# Patient Record
Sex: Female | Born: 1950 | Race: White | Hispanic: No | Smoking: Never smoker
Health system: Southern US, Community
[De-identification: ages and names within clinical notes are randomized; demographics above are authoritative.]

## PROBLEM LIST (undated history)

## (undated) DIAGNOSIS — IMO0001 Reserved for inherently not codable concepts without codable children: Secondary | ICD-10-CM

## (undated) DIAGNOSIS — D649 Anemia, unspecified: Secondary | ICD-10-CM

## (undated) DIAGNOSIS — H269 Unspecified cataract: Secondary | ICD-10-CM

## (undated) DIAGNOSIS — I499 Cardiac arrhythmia, unspecified: Secondary | ICD-10-CM

## (undated) DIAGNOSIS — C801 Malignant (primary) neoplasm, unspecified: Secondary | ICD-10-CM

## (undated) DIAGNOSIS — K219 Gastro-esophageal reflux disease without esophagitis: Secondary | ICD-10-CM

## (undated) DIAGNOSIS — K649 Unspecified hemorrhoids: Secondary | ICD-10-CM

## (undated) DIAGNOSIS — K635 Polyp of colon: Secondary | ICD-10-CM

## (undated) HISTORY — DX: Polyp of colon: K63.5

## (undated) HISTORY — PX: OPEN REDUCTION INTERNAL FIXATION (ORIF) FOOT LISFRANC FRACTURE: SHX5990

## (undated) HISTORY — DX: Unspecified hemorrhoids: K64.9

## (undated) HISTORY — DX: Unspecified cataract: H26.9

## (undated) HISTORY — PX: MOHS SURGERY: SUR867

## (undated) HISTORY — PX: ARM WOUND REPAIR / CLOSURE: SUR1141

## (undated) HISTORY — DX: Anemia, unspecified: D64.9

## (undated) HISTORY — PX: ABDOMINAL HYSTERECTOMY: SHX81

---

## 2003-11-29 ENCOUNTER — Emergency Department (HOSPITAL_COMMUNITY): Admission: EM | Admit: 2003-11-29 | Discharge: 2003-11-29 | Payer: Self-pay | Admitting: Emergency Medicine

## 2003-11-30 ENCOUNTER — Emergency Department (HOSPITAL_COMMUNITY): Admission: EM | Admit: 2003-11-30 | Discharge: 2003-11-30 | Payer: Self-pay | Admitting: Emergency Medicine

## 2003-12-03 ENCOUNTER — Ambulatory Visit: Payer: Self-pay | Admitting: Internal Medicine

## 2003-12-09 ENCOUNTER — Ambulatory Visit: Payer: Self-pay | Admitting: Internal Medicine

## 2010-03-22 ENCOUNTER — Emergency Department: Payer: Self-pay | Admitting: Emergency Medicine

## 2010-07-11 ENCOUNTER — Emergency Department: Payer: Self-pay | Admitting: Internal Medicine

## 2011-07-24 IMAGING — CT CT HEAD WITHOUT CONTRAST
2 series · 16 of 30 positions shown, 20 images · non-contrast
Comparison: none

REASON FOR EXAM: fall/LOC
COMMENTS:

PROCEDURE:     CT  - CT HEAD WITHOUT CONTRAST  - July 11, 2010  [DATE]
RESULT:     Comparison:  None
TECHNIQUE: Multiple axial images from the foramen magnum to the vertex were
obtained without IV contrast.

[Series 2: without · axial · non-contrast · 0.41mm/px · z∈[-208,-88]mm · 13 of 30 slices shown, 17 images]
[im 3/30  brain]
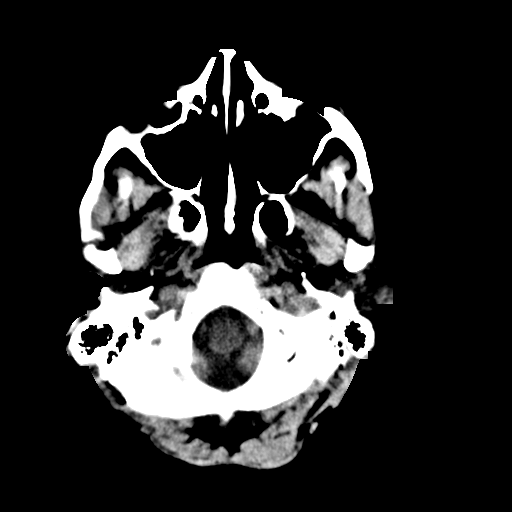
[im 3/30  bone]
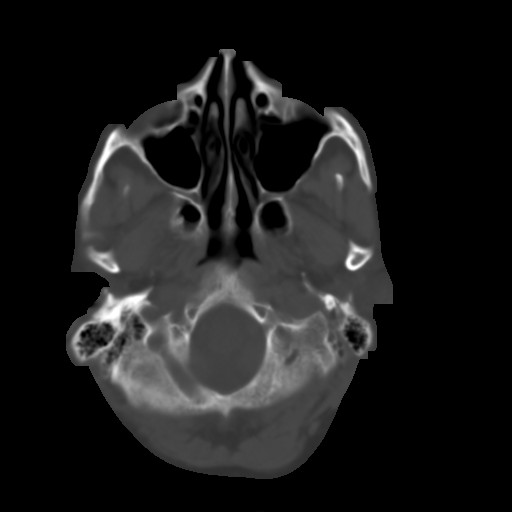
[im 5/30  brain]
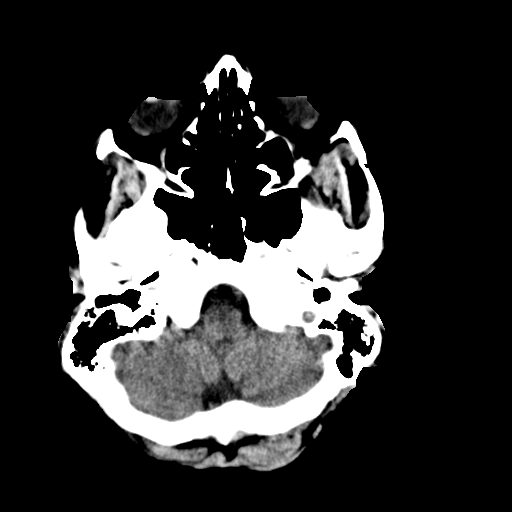
[im 7/30  brain]
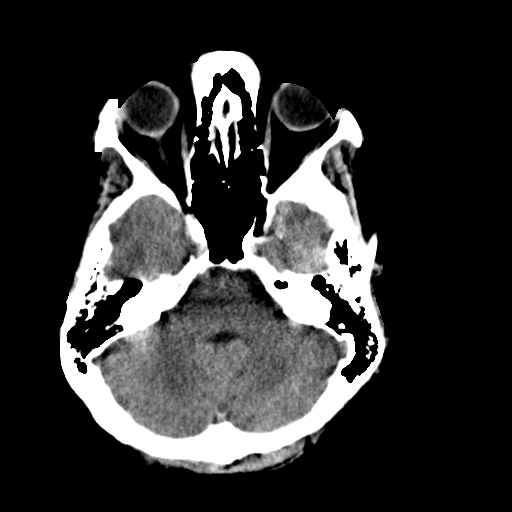
[im 9/30  brain]
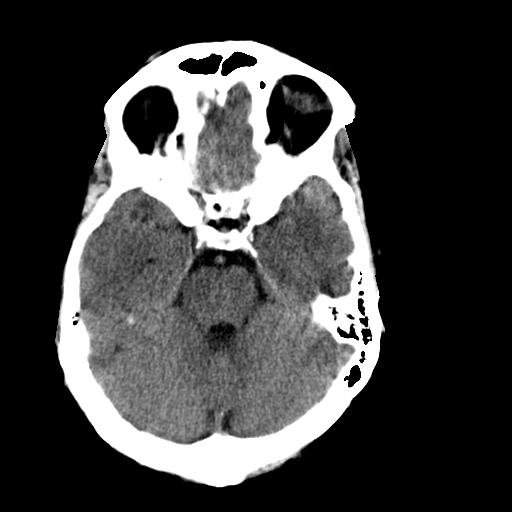
[im 11/30  brain]
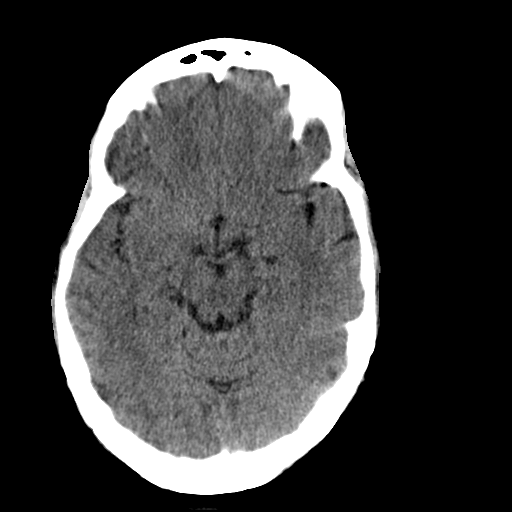
[im 11/30  bone]
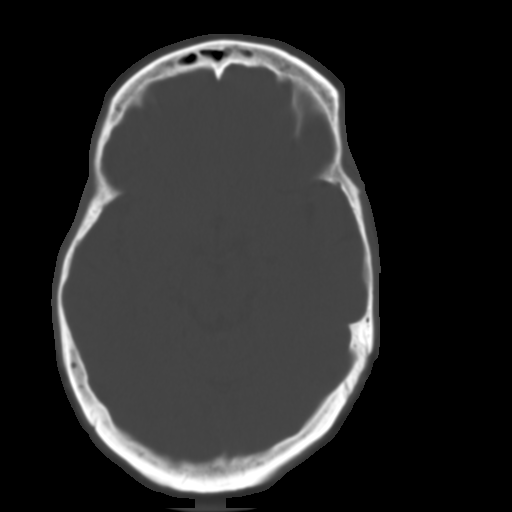
[im 13/30  brain]
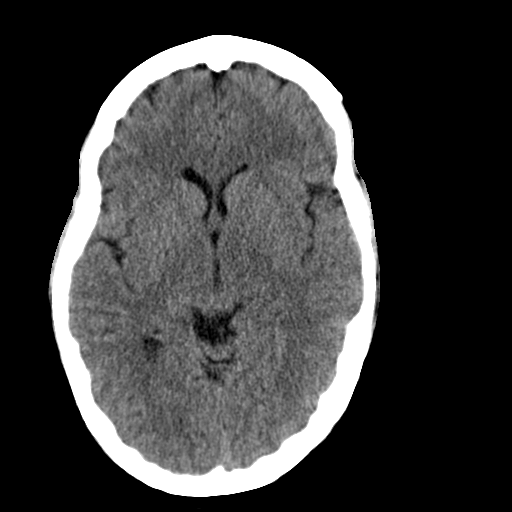
[im 15/30  brain]
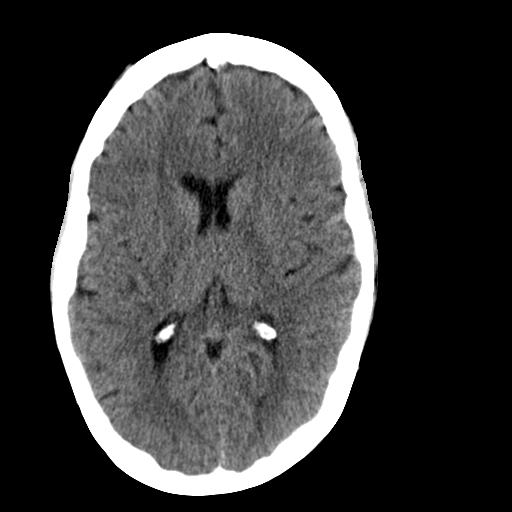
[im 17/30  brain]
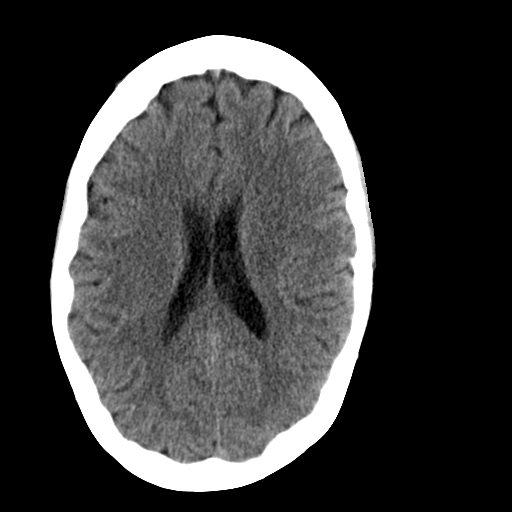
[im 19/30  brain]
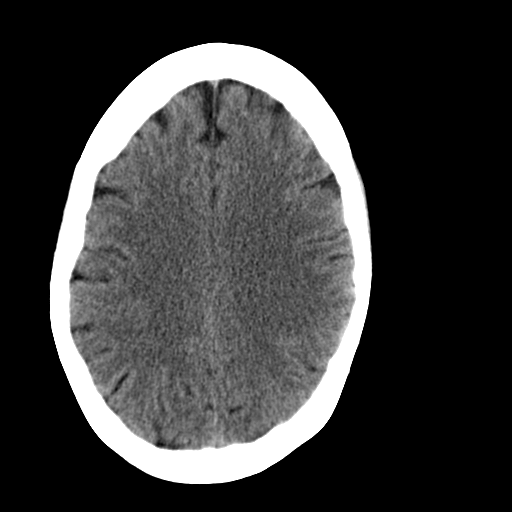
[im 19/30  bone]
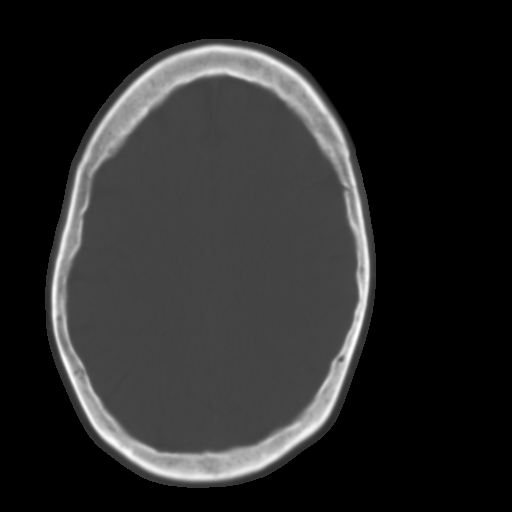
[im 21/30  brain]
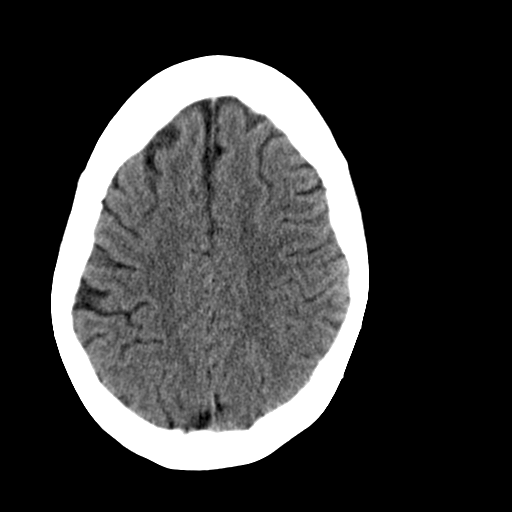
[im 23/30  brain]
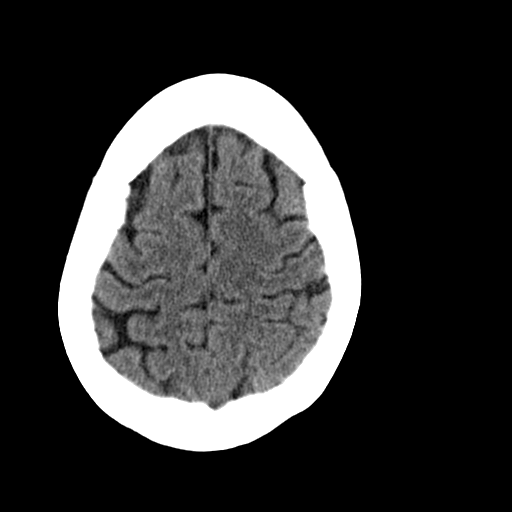
[im 25/30  brain]
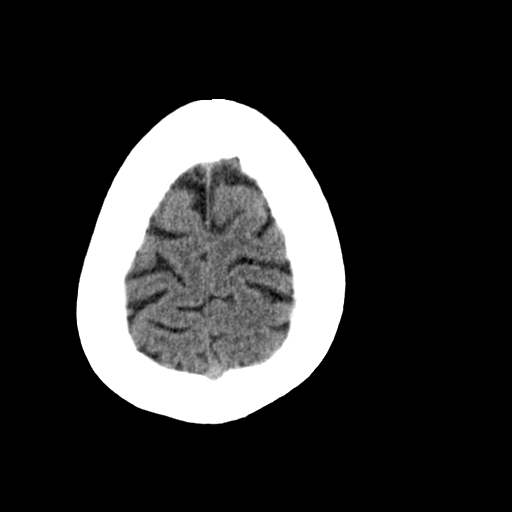
[im 27/30  brain]
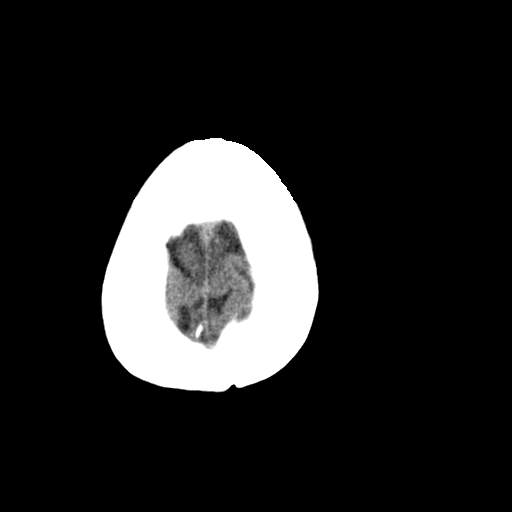
[im 27/30  bone]
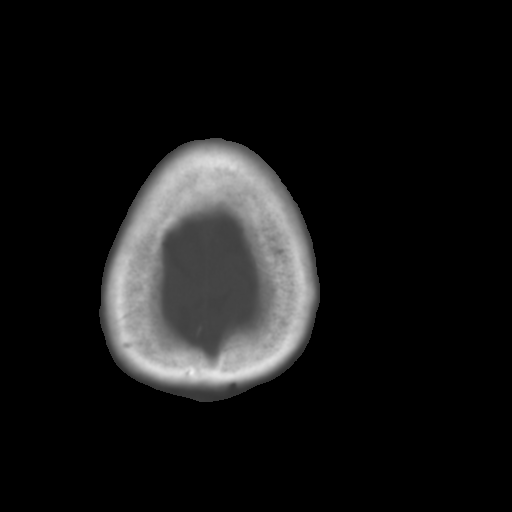

[Series 3: bone · axial · 0.41mm/px · z∈[-208,-168]mm · 3 of 30 slices shown]
[im 3/30  bone]
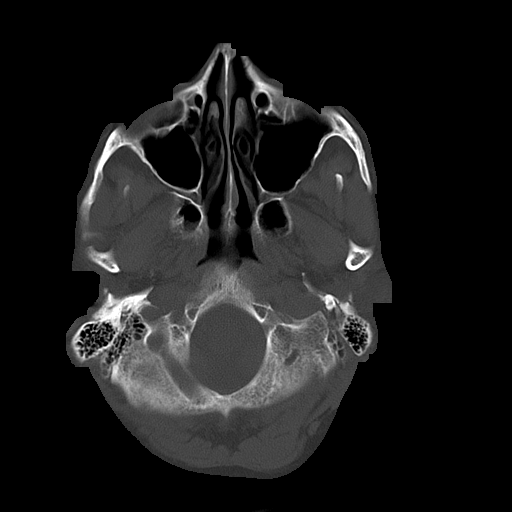
[im 7/30  bone]
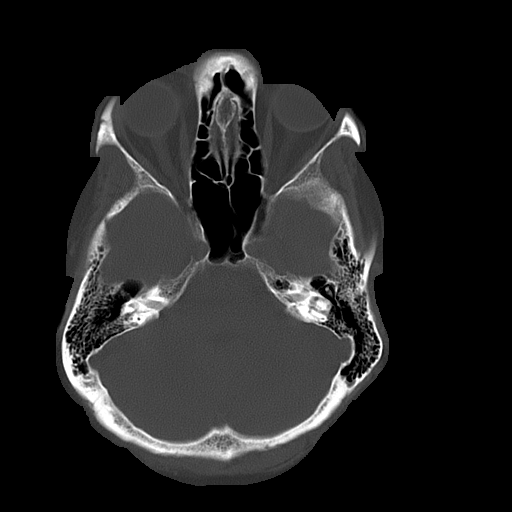
[im 11/30  bone]
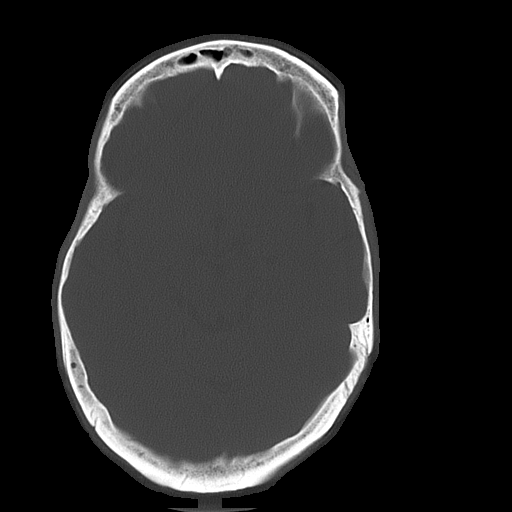

[16 of 30 positions shown; findings below may reference images not displayed]

FINDINGS: There is no evidence for mass effect, midline shift, or extra-axial fluid
collections. There is no evidence for space-occupying lesion, intracranial
hemorrhage, or cortical-based area of infarction.

The osseous structures are unremarkable.
IMPRESSION: No acute intracranial process.

## 2011-11-15 ENCOUNTER — Emergency Department: Payer: Self-pay | Admitting: Emergency Medicine

## 2011-11-15 LAB — URINALYSIS, COMPLETE
Bilirubin,UR: NEGATIVE
Glucose,UR: NEGATIVE mg/dL (ref 0–75)
Ketone: NEGATIVE
Nitrite: NEGATIVE
Ph: 8 (ref 4.5–8.0)
Protein: 100
RBC,UR: 287 /HPF (ref 0–5)
Specific Gravity: 1.012 (ref 1.003–1.030)
Squamous Epithelial: NONE SEEN
WBC UR: 666 /HPF (ref 0–5)

## 2011-11-17 LAB — URINE CULTURE

## 2014-02-27 DIAGNOSIS — H269 Unspecified cataract: Secondary | ICD-10-CM

## 2014-02-27 HISTORY — DX: Unspecified cataract: H26.9

## 2014-12-03 ENCOUNTER — Encounter: Payer: Self-pay | Admitting: *Deleted

## 2014-12-06 NOTE — H&P (Signed)
See scanned H&P

## 2014-12-07 ENCOUNTER — Ambulatory Visit: Payer: 59 | Admitting: *Deleted

## 2014-12-07 ENCOUNTER — Ambulatory Visit
Admission: RE | Admit: 2014-12-07 | Discharge: 2014-12-07 | Disposition: A | Discharge status: Home / Self Care | Payer: 59 | Source: Ambulatory Visit | Attending: Ophthalmology | Admitting: Ophthalmology

## 2014-12-07 ENCOUNTER — Encounter
Admission: RE | Disposition: A | Discharge status: Home / Self Care | Payer: Self-pay | Source: Ambulatory Visit | Attending: Ophthalmology

## 2014-12-07 DIAGNOSIS — R0602 Shortness of breath: Secondary | ICD-10-CM | POA: Diagnosis not present

## 2014-12-07 DIAGNOSIS — Z85828 Personal history of other malignant neoplasm of skin: Secondary | ICD-10-CM | POA: Diagnosis not present

## 2014-12-07 DIAGNOSIS — K219 Gastro-esophageal reflux disease without esophagitis: Secondary | ICD-10-CM | POA: Insufficient documentation

## 2014-12-07 DIAGNOSIS — I499 Cardiac arrhythmia, unspecified: Secondary | ICD-10-CM | POA: Insufficient documentation

## 2014-12-07 DIAGNOSIS — J40 Bronchitis, not specified as acute or chronic: Secondary | ICD-10-CM | POA: Diagnosis not present

## 2014-12-07 DIAGNOSIS — H2512 Age-related nuclear cataract, left eye: Secondary | ICD-10-CM | POA: Diagnosis present

## 2014-12-07 DIAGNOSIS — Z90710 Acquired absence of both cervix and uterus: Secondary | ICD-10-CM | POA: Insufficient documentation

## 2014-12-07 HISTORY — PX: CATARACT EXTRACTION W/PHACO: SHX586

## 2014-12-07 HISTORY — DX: Reserved for inherently not codable concepts without codable children: IMO0001

## 2014-12-07 HISTORY — DX: Gastro-esophageal reflux disease without esophagitis: K21.9

## 2014-12-07 HISTORY — DX: Malignant (primary) neoplasm, unspecified: C80.1

## 2014-12-07 HISTORY — DX: Cardiac arrhythmia, unspecified: I49.9

## 2014-12-07 SURGERY — PHACOEMULSIFICATION, CATARACT, WITH IOL INSERTION
Anesthesia: Monitor Anesthesia Care | Site: Eye | Laterality: Left | Wound class: Clean

## 2014-12-07 MED ORDER — HYALURONIDASE HUMAN 150 UNIT/ML IJ SOLN
INTRAMUSCULAR | Status: AC
Start: 2014-12-07 — End: 2014-12-07
  Filled 2014-12-07: qty 1

## 2014-12-07 MED ORDER — PHENYLEPHRINE HCL 10 % OP SOLN
OPHTHALMIC | Status: AC
  Administered 2014-12-07: 1 [drp] via OPHTHALMIC
  Filled 2014-12-07: qty 5

## 2014-12-07 MED ORDER — BSS IO SOLN
INTRAOCULAR | Status: DC | PRN
  Administered 2014-12-07: 200 mL via OPHTHALMIC

## 2014-12-07 MED ORDER — MOXIFLOXACIN HCL 0.5 % OP SOLN
OPHTHALMIC | Status: DC | PRN
  Administered 2014-12-07: 1 [drp] via OPHTHALMIC

## 2014-12-07 MED ORDER — TETRACAINE HCL 0.5 % OP SOLN
OPHTHALMIC | Status: DC | PRN
  Administered 2014-12-07: 2 [drp] via OPHTHALMIC

## 2014-12-07 MED ORDER — BUPIVACAINE HCL (PF) 0.75 % IJ SOLN
INTRAMUSCULAR | Status: AC
  Filled 2014-12-07: qty 10

## 2014-12-07 MED ORDER — MIDAZOLAM HCL 2 MG/2ML IJ SOLN
INTRAMUSCULAR | Status: DC | PRN
  Administered 2014-12-07: 1 mg via INTRAVENOUS

## 2014-12-07 MED ORDER — PHENYLEPHRINE HCL 10 % OP SOLN
1.0000 [drp] | OPHTHALMIC | Status: AC | PRN
  Administered 2014-12-07 (×4): 1 [drp] via OPHTHALMIC

## 2014-12-07 MED ORDER — TETRACAINE HCL 0.5 % OP SOLN
OPHTHALMIC | Status: AC
  Filled 2014-12-07: qty 2

## 2014-12-07 MED ORDER — MOXIFLOXACIN HCL 0.5 % OP SOLN
1.0000 [drp] | OPHTHALMIC | Status: AC | PRN
  Administered 2014-12-07 (×3): 1 [drp] via OPHTHALMIC

## 2014-12-07 MED ORDER — CEFUROXIME OPHTHALMIC INJECTION 1 MG/0.1 ML
INJECTION | OPHTHALMIC | Status: AC
  Filled 2014-12-07: qty 0.1

## 2014-12-07 MED ORDER — LIDOCAINE HCL (PF) 4 % IJ SOLN
INTRAMUSCULAR | Status: DC | PRN
  Administered 2014-12-07: 5 mL via OPHTHALMIC

## 2014-12-07 MED ORDER — NA CHONDROIT SULF-NA HYALURON 40-17 MG/ML IO SOLN
INTRAOCULAR | Status: AC
  Filled 2014-12-07: qty 1

## 2014-12-07 MED ORDER — NA CHONDROIT SULF-NA HYALURON 40-17 MG/ML IO SOLN
INTRAOCULAR | Status: DC | PRN
  Administered 2014-12-07: 1 mL via INTRAOCULAR

## 2014-12-07 MED ORDER — PROPARACAINE HCL 0.5 % OP SOLN
OPHTHALMIC | Status: DC | PRN
  Administered 2014-12-07: 2 [drp] via OPHTHALMIC

## 2014-12-07 MED ORDER — EPINEPHRINE HCL 1 MG/ML IJ SOLN
INTRAMUSCULAR | Status: AC
  Filled 2014-12-07: qty 1

## 2014-12-07 MED ORDER — CARBACHOL 0.01 % IO SOLN
INTRAOCULAR | Status: DC | PRN
Start: 2014-12-07 — End: 2014-12-07
  Administered 2014-12-07: 0.5 mL via INTRAOCULAR

## 2014-12-07 MED ORDER — CYCLOPENTOLATE HCL 2 % OP SOLN
OPHTHALMIC | Status: AC
  Administered 2014-12-07: 1 [drp] via OPHTHALMIC
  Filled 2014-12-07: qty 2

## 2014-12-07 MED ORDER — PROPARACAINE HCL 0.5 % OP SOLN
OPHTHALMIC | Status: AC
  Filled 2014-12-07: qty 15

## 2014-12-07 MED ORDER — ALFENTANIL 500 MCG/ML IJ INJ
INJECTION | INTRAMUSCULAR | Status: DC | PRN
  Administered 2014-12-07: 500 ug via INTRAVENOUS

## 2014-12-07 MED ORDER — LIDOCAINE HCL (PF) 4 % IJ SOLN
INTRAOCULAR | Status: DC | PRN
  Administered 2014-12-07: 4 mL via OPHTHALMIC

## 2014-12-07 MED ORDER — SODIUM CHLORIDE 0.9 % IV SOLN
INTRAVENOUS | Status: DC
  Administered 2014-12-07: 11:00:00 via INTRAVENOUS

## 2014-12-07 MED ORDER — MOXIFLOXACIN HCL 0.5 % OP SOLN
OPHTHALMIC | Status: AC
  Administered 2014-12-07: 1 [drp] via OPHTHALMIC
  Filled 2014-12-07: qty 3

## 2014-12-07 MED ORDER — CEFUROXIME OPHTHALMIC INJECTION 1 MG/0.1 ML
INJECTION | OPHTHALMIC | Status: DC | PRN
  Administered 2014-12-07: 0.1 mL via OPHTHALMIC

## 2014-12-07 MED ORDER — CYCLOPENTOLATE HCL 2 % OP SOLN
1.0000 [drp] | OPHTHALMIC | Status: AC | PRN
  Administered 2014-12-07 (×4): 1 [drp] via OPHTHALMIC

## 2014-12-07 MED ORDER — LIDOCAINE HCL (PF) 4 % IJ SOLN
INTRAMUSCULAR | Status: AC
  Filled 2014-12-07: qty 5

## 2014-12-07 SURGICAL SUPPLY — 33 items
CANNULA ANT/CHMB 27G (MISCELLANEOUS) ×1 IMPLANT
CANNULA ANT/CHMB 27GA (MISCELLANEOUS) ×3 IMPLANT
CORD BIP STRL DISP 12FT (MISCELLANEOUS) ×3 IMPLANT
CUP MEDICINE 2OZ PLAST GRAD ST (MISCELLANEOUS) ×3 IMPLANT
DRAPE XRAY CASSETTE 23X24 (DRAPES) ×3 IMPLANT
ERASER HMR WETFIELD 18G (MISCELLANEOUS) ×3 IMPLANT
GLOVE BIO SURGEON STRL SZ8 (GLOVE) ×3 IMPLANT
GLOVE SURG LX 6.5 MICRO (GLOVE) ×2
GLOVE SURG LX 8.0 MICRO (GLOVE) ×2
GLOVE SURG LX STRL 6.5 MICRO (GLOVE) ×1 IMPLANT
GLOVE SURG LX STRL 8.0 MICRO (GLOVE) ×1 IMPLANT
GOWN STRL REUS W/ TWL LRG LVL3 (GOWN DISPOSABLE) ×1 IMPLANT
GOWN STRL REUS W/ TWL XL LVL3 (GOWN DISPOSABLE) ×1 IMPLANT
GOWN STRL REUS W/TWL LRG LVL3 (GOWN DISPOSABLE) ×3
GOWN STRL REUS W/TWL XL LVL3 (GOWN DISPOSABLE) ×3
LENS IOL ACRSF IQ TRC 9 24.5 (Intraocular Lens) IMPLANT
LENS IOL ACRYSOF IQ TORIC 24.5 (Intraocular Lens) ×3 IMPLANT
LENS IOL IQ TORIC 9 24.5 (Intraocular Lens) ×1 IMPLANT
MARKER SKIN W/RULER 31145785 (MISCELLANEOUS) ×2 IMPLANT
PACK CATARACT (MISCELLANEOUS) ×3 IMPLANT
PACK CATARACT DINGLEDEIN LX (MISCELLANEOUS) ×3 IMPLANT
PACK EYE AFTER SURG (MISCELLANEOUS) ×3 IMPLANT
SHLD EYE VISITEC  UNIV (MISCELLANEOUS) ×3 IMPLANT
SOL BSS BAG (MISCELLANEOUS) ×3
SOL PREP PVP 2OZ (MISCELLANEOUS) ×3
SOLUTION BSS BAG (MISCELLANEOUS) ×1 IMPLANT
SOLUTION PREP PVP 2OZ (MISCELLANEOUS) ×1 IMPLANT
SUT SILK 5-0 (SUTURE) ×3 IMPLANT
SYR 3ML LL SCALE MARK (SYRINGE) ×3 IMPLANT
SYR 5ML LL (SYRINGE) ×3 IMPLANT
SYR TB 1ML 27GX1/2 LL (SYRINGE) ×3 IMPLANT
WATER STERILE IRR 1000ML POUR (IV SOLUTION) ×3 IMPLANT
WIPE NON LINTING 3.25X3.25 (MISCELLANEOUS) ×3 IMPLANT

## 2014-12-07 NOTE — Anesthesia Postprocedure Evaluation (Signed)
  Anesthesia Post-op Note  Patient: Anna Liu  Procedure(s) Performed: Procedure(s) with comments: CATARACT EXTRACTION PHACO AND INTRAOCULAR LENS PLACEMENT (IOC) (Left) - MZT#[id number] H us:01:14.8 AP%:24.1 CDE:32.29  Anesthesia type:MAC  Patient location: PACU  Post pain: Pain level controlled  Post assessment: Post-op Vital signs reviewed, Patient's Cardiovascular Status Stable, Respiratory Function Stable, Patent Airway and No signs of Nausea or vomiting  Post vital signs: Reviewed and stable  Last Vitals:  Filed Vitals:   12/07/14 1448  BP: 158/83  Pulse: 67  Temp:   Resp: 16    Level of consciousness: awake, alert  and patient cooperative  Complications: No apparent anesthesia complications

## 2014-12-07 NOTE — Interval H&P Note (Signed)
History and Physical Interval Note:  12/07/2014 1:47 PM  Anna Liu  has presented today for surgery, with the diagnosis of CATARACT  The various methods of treatment have been discussed with the patient and family. After consideration of risks, benefits and other options for treatment, the patient has consented to  Procedure(s): CATARACT EXTRACTION PHACO AND INTRAOCULAR LENS PLACEMENT ([place]) (Left) as a surgical intervention .  The patient's history has been reviewed, patient examined, no change in status, stable for surgery.  I have reviewed the patient's chart and labs.  Questions were answered to the patient's satisfaction.     Katrena Stehlin

## 2014-12-07 NOTE — Anesthesia Preprocedure Evaluation (Signed)
Anesthesia Evaluation  Patient identified by MRN, date of birth, ID band Patient awake    Reviewed: Allergy & Precautions, NPO status , Patient's Chart, lab work & pertinent test results  Airway Mallampati: II  TM Distance: >3 FB Neck ROM: Limited    Dental  (+) Teeth Intact   Pulmonary shortness of breath and with exertion,           Cardiovascular negative cardio ROS Normal cardiovascular exam     Neuro/Psych    GI/Hepatic   Endo/Other    Renal/GU      Musculoskeletal   Abdominal (+)  Abdomen: soft.    Peds  Hematology   Anesthesia Other Findings   Reproductive/Obstetrics                             Anesthesia Physical Anesthesia Plan  ASA: II  Anesthesia Plan: MAC   Post-op Pain Management:    Induction: Intravenous  Airway Management Planned: Nasal Cannula  Additional Equipment:   Intra-op Plan:   Post-operative Plan:   Informed Consent: I have reviewed the patients History and Physical, chart, labs and discussed the procedure including the risks, benefits and alternatives for the proposed anesthesia with the patient or authorized representative who has indicated his/her understanding and acceptance.     Plan Discussed with: CRNA  Anesthesia Plan Comments:         Anesthesia Quick Evaluation

## 2014-12-07 NOTE — Anesthesia Procedure Notes (Signed)
Procedure Name: MAC Performed by: Jalaysia Lobb Pre-anesthesia Checklist: Patient identified, Emergency Drugs available, Suction available, Patient being monitored and Timeout performed Oxygen Delivery Method: Nasal cannula     

## 2014-12-07 NOTE — Discharge Instructions (Signed)
Eye Surgery Discharge Instructions  Expect mild scratchy sensation or mild soreness. DO NOT RUB YOUR EYE!  The day of surgery:  Minimal physical activity, but bed rest is not required  No reading, computer work, or close hand work  No bending, lifting, or straining.  May watch TV  For 24 hours:  No driving, legal decisions, or alcoholic beverages  Safety precautions  Eat anything you prefer: It is better to start with liquids, then soup then solid foods.  _____ Eye patch should be worn until postoperative exam tomorrow.  ____ Solar shield eyeglasses should be worn for comfort in the sunlight/patch while sleeping  Resume all regular medications including aspirin or Coumadin if these were discontinued prior to surgery. You may shower, bathe, shave, or wash your hair. Tylenol may be taken for mild discomfort.  Call your doctor if you experience significant pain, nausea, or vomiting, fever > 101 or other signs of infection. [phone] or [phone] Specific instructions:  Follow-up Information    Follow up with DINGELDEIN,STEVEN, MD In 1 day.   Specialty:  Ophthalmology   Why:  October 11 at 10:10am   Contact information:   [place] [address] [phone]     AMBULATORY SURGERY  DISCHARGE INSTRUCTIONS   1) The drugs that you were given will stay in your system until tomorrow so for the next 24 hours you should not:  A) Drive an automobile B) Make any legal decisions C) Drink any alcoholic beverage   2) You may resume regular meals tomorrow.  Today it is better to start with liquids and gradually work up to solid foods.  You may eat anything you prefer, but it is better to start with liquids, then soup and crackers, and gradually work up to solid foods.   3) Please notify your doctor immediately if you have any unusual bleeding, trouble breathing, redness and pain at the surgery site, drainage, fever, or pain not relieved by  medication.    4) Additional Instructions:        Please contact your physician with any problems or Same Day Surgery at [phone], Monday through Friday 6 am to 4 pm, or [place] at [facility] number at [phone].

## 2014-12-07 NOTE — Transfer of Care (Signed)
Immediate Anesthesia Transfer of Care Note  Patient: Anna Liu  Procedure(s) Performed: Procedure(s) with comments: CATARACT EXTRACTION PHACO AND INTRAOCULAR LENS PLACEMENT ([facility]) (Left) - VTV#[id number] H us:01:14.8 AP%:24.1 CDE:32.29  Patient Location: PACU  Anesthesia Type:MAC  Level of Consciousness: awake  Airway & Oxygen Therapy: Patient Spontanous Breathing  Post-op Assessment: Report given to RN and Post -op Vital signs reviewed and stable  Post vital signs: Reviewed and stable  Last Vitals:  Filed Vitals:   12/07/14 1443  BP: 140/66  Pulse: 61  Temp: 36.5 C  Resp: 16    Complications: No apparent anesthesia complications

## 2014-12-07 NOTE — Op Note (Signed)
Date of Surgery: 12/07/2014 Date of Dictation: 12/07/2014 2:40 PM Pre-operative Diagnosis:Nuclear Sclerotic Cataract left Eye Post-operative Diagnosis: same Procedure performed: Extra-capsular Cataract Extraction (ECCE) with placement of a posterior chamber intraocular lens (IOL) left Eye IOL:  Implant Name Type Inv. Item Serial No. Manufacturer Lot No. LRB No. Used  toric lens     [id number] ALCON   Left 1   Anesthesia: 2% Lidocaine and 4% Marcaine in a 50/50 mixture with 10 unites/ml of Hylenex given as a peribulbar Anesthesiologist: Anesthesiologist: Elyse Hsu, MD CRNA: Aline Brochure, CRNA; Allean Found, CRNA Complications: none Estimated Blood Loss: less than 1 ml  Description of procedure:  The patient sat upright and the eyes were anesthetized with topical proparacaine. With the patient fixing at a distant target the 3 o'clock and 9 o'clock positions at the limbus were marked with an sterile indelible surgical marking pen.  The patient was given anesthesia and sedation via intravenous access. The patient was then prepped and draped in the usual fashion. A 25-gauge needle was bent to use for starting the capsulorhexis. A 5-0 silk suture was placed through the conjunctiva superior and inferiorly to serve as bridle sutures.   The [place] system was engaged and registration was performed. The positions of the incisions (110) and the steep axis of the astigmatism (120 degrees) were identified by the [place] system and marked with an indelible pen. The Verion heads up display was turned off.  Hemostasis was obtained at the the position of the main incision using an eraser cautery. A partial thickness groove was made at the at that location with a 64 Beaver blade and this was dissected anteriorly with an Avaya. The anterior chamber was entered at 10 o'clock with a 1.0 mm paracentesis knife and through the lamellar dissection with a 2.6 mm Alcon keratome. Epi-Shugarcaine 0.5 CC  [9 cc BSS Plus (Alcon), 3 cc 4% preservative-free lidocaine (Hospira) and 4 cc 1:1000 preservative-free, bisulfite-free epinephrine] was injected into the anterior chamber via the paracentesis tract. DiscoVisc was injected to replace the aqueous and a continuous tear curvilinear capsulorhexis was performed using a bent 25-gauge needle.  Balance salt on a syringe was used to perform hydro-dissection and phacoemulsification was carried out using a divide and conquer technique. Procedure(s) with comments: CATARACT EXTRACTION PHACO AND INTRAOCULAR LENS PLACEMENT (IOC) (Left) - [id number]#[id number] H us:01:14.8 AP%:24.1 CDE:32.29.   Irrigation/aspiration was used to remove the residual cortex and the capsular bag was inflated with DiscoVisc. The intraocular lens was inserted into the capsular bag using a Monarch Delivery System cartridge. The Verion heads up display was turned on and the lens was rotated so that the marks on the base of the haptics were aligned with the steep axis of astigmatism as identified by the Verion unit (120 degrees).   Irrigation/aspiration was used to remove the residual DiscoVisc. The I/A hand piece was pressed down on top of the lens to prevent rotation. The wound was inflated with balanced salt and checked for leaks. None were found. The position of the Toric lens was reconfirmed using the [place] unit. Miostat was injected via the paracentesis track and 0.1 ml of cefuroxime containing 1 mg of drug was injected via the paracentesis track. The wound was checked for leaks again and none were found.   The bridal sutures were removed and two drops of Vigamox were placed on the eye. An eye shield was placed to protect the eye and the patient was discharged to the recovery area in good condition.  Twala Collings MD @T @

## 2015-01-28 ENCOUNTER — Encounter: Payer: Self-pay | Admitting: *Deleted

## 2015-02-01 ENCOUNTER — Encounter
Admission: RE | Disposition: A | Discharge status: Home / Self Care | Payer: Self-pay | Source: Ambulatory Visit | Attending: Ophthalmology

## 2015-02-01 ENCOUNTER — Ambulatory Visit: Payer: 59 | Admitting: Anesthesiology

## 2015-02-01 ENCOUNTER — Ambulatory Visit
Admission: RE | Admit: 2015-02-01 | Discharge: 2015-02-01 | Disposition: A | Discharge status: Home / Self Care | Payer: 59 | Source: Ambulatory Visit | Attending: Ophthalmology | Admitting: Ophthalmology

## 2015-02-01 ENCOUNTER — Encounter: Payer: Self-pay | Admitting: *Deleted

## 2015-02-01 DIAGNOSIS — R0602 Shortness of breath: Secondary | ICD-10-CM | POA: Diagnosis not present

## 2015-02-01 DIAGNOSIS — Z9842 Cataract extraction status, left eye: Secondary | ICD-10-CM | POA: Diagnosis not present

## 2015-02-01 DIAGNOSIS — K219 Gastro-esophageal reflux disease without esophagitis: Secondary | ICD-10-CM | POA: Insufficient documentation

## 2015-02-01 DIAGNOSIS — Z85828 Personal history of other malignant neoplasm of skin: Secondary | ICD-10-CM | POA: Insufficient documentation

## 2015-02-01 DIAGNOSIS — Z90710 Acquired absence of both cervix and uterus: Secondary | ICD-10-CM | POA: Diagnosis not present

## 2015-02-01 DIAGNOSIS — H2511 Age-related nuclear cataract, right eye: Secondary | ICD-10-CM | POA: Diagnosis not present

## 2015-02-01 DIAGNOSIS — I499 Cardiac arrhythmia, unspecified: Secondary | ICD-10-CM | POA: Insufficient documentation

## 2015-02-01 HISTORY — PX: CATARACT EXTRACTION W/PHACO: SHX586

## 2015-02-01 SURGERY — PHACOEMULSIFICATION, CATARACT, WITH IOL INSERTION
Anesthesia: Monitor Anesthesia Care | Site: Eye | Laterality: Right | Wound class: Clean

## 2015-02-01 MED ORDER — CYCLOPENTOLATE HCL 2 % OP SOLN
1.0000 [drp] | OPHTHALMIC | Status: AC
  Administered 2015-02-01 (×4): 1 [drp] via OPHTHALMIC

## 2015-02-01 MED ORDER — EPINEPHRINE HCL 1 MG/ML IJ SOLN
INTRAOCULAR | Status: DC | PRN
  Administered 2015-02-01: 1 mL via OPHTHALMIC

## 2015-02-01 MED ORDER — MOXIFLOXACIN HCL 0.5 % OP SOLN
OPHTHALMIC | Status: AC
  Administered 2015-02-01: 1 [drp] via OPHTHALMIC
  Filled 2015-02-01: qty 3

## 2015-02-01 MED ORDER — ALFENTANIL 500 MCG/ML IJ INJ
INJECTION | INTRAMUSCULAR | Status: DC | PRN
  Administered 2015-02-01: 500 ug via INTRAVENOUS

## 2015-02-01 MED ORDER — PHENYLEPHRINE HCL 10 % OP SOLN
1.0000 [drp] | OPHTHALMIC | Status: AC
  Administered 2015-02-01 (×4): 1 [drp] via OPHTHALMIC

## 2015-02-01 MED ORDER — CEFUROXIME OPHTHALMIC INJECTION 1 MG/0.1 ML
INJECTION | OPHTHALMIC | Status: DC | PRN
  Administered 2015-02-01: .1 mL via INTRACAMERAL

## 2015-02-01 MED ORDER — MOXIFLOXACIN HCL 0.5 % OP SOLN
1.0000 [drp] | OPHTHALMIC | Status: AC
  Administered 2015-02-01 (×3): 1 [drp] via OPHTHALMIC

## 2015-02-01 MED ORDER — CEFUROXIME OPHTHALMIC INJECTION 1 MG/0.1 ML
INJECTION | OPHTHALMIC | Status: AC
  Filled 2015-02-01: qty 0.1

## 2015-02-01 MED ORDER — TETRACAINE HCL 0.5 % OP SOLN
OPHTHALMIC | Status: DC | PRN
  Administered 2015-02-01: 1 [drp] via OPHTHALMIC

## 2015-02-01 MED ORDER — LIDOCAINE HCL (PF) 4 % IJ SOLN
INTRAMUSCULAR | Status: AC
  Filled 2015-02-01: qty 10

## 2015-02-01 MED ORDER — FENTANYL CITRATE (PF) 100 MCG/2ML IJ SOLN: 25.0000 ug | INTRAMUSCULAR | Status: DC | PRN

## 2015-02-01 MED ORDER — PROPARACAINE HCL 0.5 % OP SOLN
OPHTHALMIC | Status: DC | PRN
  Administered 2015-02-01: 2 [drp] via OPHTHALMIC

## 2015-02-01 MED ORDER — OXYCODONE HCL 5 MG PO TABS: 5.0000 mg | ORAL_TABLET | Freq: Once | ORAL | Status: DC | PRN

## 2015-02-01 MED ORDER — LIDOCAINE HCL (PF) 4 % IJ SOLN
INTRAMUSCULAR | Status: DC | PRN
  Administered 2015-02-01: 4 mL via OPHTHALMIC

## 2015-02-01 MED ORDER — PHENYLEPHRINE HCL 10 % OP SOLN
OPHTHALMIC | Status: AC
  Administered 2015-02-01: 1 [drp] via OPHTHALMIC
  Filled 2015-02-01: qty 5

## 2015-02-01 MED ORDER — CYCLOPENTOLATE HCL 2 % OP SOLN
OPHTHALMIC | Status: AC
  Administered 2015-02-01: 1 [drp] via OPHTHALMIC
  Filled 2015-02-01: qty 2

## 2015-02-01 MED ORDER — MOXIFLOXACIN HCL 0.5 % OP SOLN
OPHTHALMIC | Status: DC | PRN
  Administered 2015-02-01: 1 [drp] via OPHTHALMIC

## 2015-02-01 MED ORDER — EPINEPHRINE HCL 1 MG/ML IJ SOLN
INTRAMUSCULAR | Status: AC
  Filled 2015-02-01: qty 2

## 2015-02-01 MED ORDER — NA CHONDROIT SULF-NA HYALURON 40-17 MG/ML IO SOLN
INTRAOCULAR | Status: AC
  Filled 2015-02-01: qty 1

## 2015-02-01 MED ORDER — TETRACAINE HCL 0.5 % OP SOLN
OPHTHALMIC | Status: AC
  Filled 2015-02-01: qty 2

## 2015-02-01 MED ORDER — SODIUM CHLORIDE 0.9 % IV SOLN
INTRAVENOUS | Status: DC
  Administered 2015-02-01: 07:00:00 via INTRAVENOUS

## 2015-02-01 MED ORDER — CARBACHOL 0.01 % IO SOLN
INTRAOCULAR | Status: DC | PRN
  Administered 2015-02-01: .5 mL via INTRAOCULAR

## 2015-02-01 MED ORDER — OXYCODONE HCL 5 MG/5ML PO SOLN: 5.0000 mg | Freq: Once | ORAL | Status: DC | PRN

## 2015-02-01 MED ORDER — ONDANSETRON HCL 4 MG/2ML IJ SOLN
INTRAMUSCULAR | Status: DC | PRN
  Administered 2015-02-01: 4 mg via INTRAVENOUS

## 2015-02-01 MED ORDER — NA CHONDROIT SULF-NA HYALURON 40-17 MG/ML IO SOLN
INTRAOCULAR | Status: DC | PRN
  Administered 2015-02-01: 1 mL via INTRAOCULAR

## 2015-02-01 MED ORDER — BUPIVACAINE HCL (PF) 0.75 % IJ SOLN
INTRAMUSCULAR | Status: AC
  Filled 2015-02-01: qty 10

## 2015-02-01 MED ORDER — HYALURONIDASE HUMAN 150 UNIT/ML IJ SOLN
INTRAMUSCULAR | Status: AC
  Filled 2015-02-01: qty 1

## 2015-02-01 MED ORDER — LIDOCAINE HCL (PF) 4 % IJ SOLN
INTRAMUSCULAR | Status: DC | PRN
  Administered 2015-02-01: .1 mL via OPHTHALMIC

## 2015-02-01 SURGICAL SUPPLY — 32 items
CANNULA ANT/CHMB 27G (MISCELLANEOUS) ×1 IMPLANT
CANNULA ANT/CHMB 27GA (MISCELLANEOUS) ×3 IMPLANT
CORD BIP STRL DISP 12FT (MISCELLANEOUS) ×3 IMPLANT
CUP MEDICINE 2OZ PLAST GRAD ST (MISCELLANEOUS) ×3 IMPLANT
DRAPE XRAY CASSETTE 23X24 (DRAPES) ×3 IMPLANT
ERASER HMR WETFIELD 18G (MISCELLANEOUS) ×3 IMPLANT
GLOVE BIO SURGEON STRL SZ8 (GLOVE) ×3 IMPLANT
GLOVE SURG LX 6.5 MICRO (GLOVE) ×2
GLOVE SURG LX 8.0 MICRO (GLOVE) ×2
GLOVE SURG LX STRL 6.5 MICRO (GLOVE) ×1 IMPLANT
GLOVE SURG LX STRL 8.0 MICRO (GLOVE) ×1 IMPLANT
GOWN STRL REUS W/ TWL LRG LVL3 (GOWN DISPOSABLE) ×1 IMPLANT
GOWN STRL REUS W/ TWL XL LVL3 (GOWN DISPOSABLE) ×1 IMPLANT
GOWN STRL REUS W/TWL LRG LVL3 (GOWN DISPOSABLE) ×3
GOWN STRL REUS W/TWL XL LVL3 (GOWN DISPOSABLE) ×3
LENS IOL ACRSF IQ TRC 4 23.5 (Intraocular Lens) IMPLANT
LENS IOL ACRYSOF IQ TORIC 23.5 (Intraocular Lens) ×3 IMPLANT
LENS IOL IQ TORIC 4 23.5 (Intraocular Lens) ×1 IMPLANT
PACK CATARACT (MISCELLANEOUS) ×3 IMPLANT
PACK CATARACT DINGLEDEIN LX (MISCELLANEOUS) ×3 IMPLANT
PACK EYE AFTER SURG (MISCELLANEOUS) ×3 IMPLANT
SHLD EYE VISITEC  UNIV (MISCELLANEOUS) ×3 IMPLANT
SOL BSS BAG (MISCELLANEOUS) ×3
SOL PREP PVP 2OZ (MISCELLANEOUS) ×3
SOLUTION BSS BAG (MISCELLANEOUS) ×1 IMPLANT
SOLUTION PREP PVP 2OZ (MISCELLANEOUS) ×1 IMPLANT
SUT SILK 5-0 (SUTURE) ×3 IMPLANT
SYR 3ML LL SCALE MARK (SYRINGE) ×3 IMPLANT
SYR 5ML LL (SYRINGE) ×3 IMPLANT
SYR TB 1ML 27GX1/2 LL (SYRINGE) ×3 IMPLANT
WATER STERILE IRR 1000ML POUR (IV SOLUTION) ×3 IMPLANT
WIPE NON LINTING 3.25X3.25 (MISCELLANEOUS) ×3 IMPLANT

## 2015-02-01 NOTE — Discharge Instructions (Signed)
AMBULATORY SURGERY  DISCHARGE INSTRUCTIONS   1) The drugs that you were given will stay in your system until tomorrow so for the next 24 hours you should not:  A) Drive an automobile B) Make any legal decisions C) Drink any alcoholic beverage   2) You may resume regular meals tomorrow.  Today it is better to start with liquids and gradually work up to solid foods.  You may eat anything you prefer, but it is better to start with liquids, then soup and crackers, and gradually work up to solid foods.   3) Please notify your doctor immediately if you have any unusual bleeding, trouble breathing, redness and pain at the surgery site, drainage, fever, or pain not relieved by medication.    4) Additional Instructions:    Eye Surgery Discharge Instructions  Expect mild scratchy sensation or mild soreness. DO NOT RUB YOUR EYE!  The day of surgery:  Minimal physical activity, but bed rest is not required  No reading, computer work, or close hand work  No bending, lifting, or straining.  May watch TV  For 24 hours:  No driving, legal decisions, or alcoholic beverages  Safety precautions  Eat anything you prefer: It is better to start with liquids, then soup then solid foods.  _____ Eye patch should be worn until postoperative exam tomorrow.  ____ Solar shield eyeglasses should be worn for comfort in the sunlight/patch while sleeping  Resume all regular medications including aspirin or Coumadin if these were discontinued prior to surgery. You may shower, bathe, shave, or wash your hair. Tylenol may be taken for mild discomfort.  Call your doctor if you experience significant pain, nausea, or vomiting, fever > 101 or other signs of infection. [phone] or [phone] Specific instructions:  Follow-up Information    Follow up with Levana Minetti, MD In 1 day.   Specialty:  Ophthalmology   Why:  December 6 at 1:20pm   Contact information:   [address]   [place] [address] [phone]         Please contact your physician with any problems or Same Day Surgery at [phone], Monday through Friday 6 am to 4 pm, or [place] at [facility] number at [phone].

## 2015-02-01 NOTE — Interval H&P Note (Signed)
History and Physical Interval Note:  02/01/2015 7:27 AM  Anna Liu  has presented today for surgery, with the diagnosis of CATARACT  The various methods of treatment have been discussed with the patient and family. After consideration of risks, benefits and other options for treatment, the patient has consented to  Procedure(s): CATARACT EXTRACTION PHACO AND INTRAOCULAR LENS PLACEMENT ([place]) (Right) as a surgical intervention .  The patient's history has been reviewed, patient examined, no change in status, stable for surgery.  I have reviewed the patient's chart and labs.  Questions were answered to the patient's satisfaction.     Shaylee Stanislawski

## 2015-02-01 NOTE — Anesthesia Postprocedure Evaluation (Signed)
Anesthesia Post Note  Patient: Anna Liu  Procedure(s) Performed: Procedure(s) (LRB): CATARACT EXTRACTION PHACO AND INTRAOCULAR LENS PLACEMENT (I[facility]) (Right)  Patient location during evaluation: PACU Anesthesia Type: MAC Level of consciousness: awake and alert Pain management: pain level controlled Vital Signs Assessment: post-procedure vital signs reviewed and stable Respiratory status: spontaneous breathing, nonlabored ventilation, respiratory function stable and patient connected to nasal cannula oxygen Cardiovascular status: stable and blood pressure returned to baseline Anesthetic complications: no    Last Vitals:  Filed Vitals:   02/01/15 0913 02/01/15 0915  BP: 148/70 148/70  Pulse: 61   Temp: 36.9 C 36.8 C  Resp: 16 18    Last Pain: There were no vitals filed for this visit.               Precious Haws Piscitello

## 2015-02-01 NOTE — Transfer of Care (Signed)
Immediate Anesthesia Transfer of Care Note  Patient: Anna Liu  Procedure(s) Performed: Procedure(s) with comments: CATARACT EXTRACTION PHACO AND INTRAOCULAR LENS PLACEMENT (I[facility]) (Right) - [place] 2:12 AP% 59.5 CDE 65.14 fluid pack lot [id number]  Patient Location: PACU and Short Stay  Anesthesia Type:MAC  Level of Consciousness: awake, alert  and oriented  Airway & Oxygen Therapy: Patient Spontanous Breathing  Post-op Assessment: Report given to RN and Post -op Vital signs reviewed and stable  Post vital signs: Reviewed and stable  Last Vitals:  Filed Vitals:   02/01/15 0643 02/01/15 0915  BP: 149/88 148/70  Pulse: 62   Temp: 36.8 C 36.8 C  Resp: 16 18    Complications: No apparent anesthesia complications

## 2015-02-01 NOTE — Op Note (Signed)
Date of Surgery: 02/01/2015 Date of Dictation: 02/01/2015 9:12 AM Pre-operative Diagnosis:Nuclear Sclerotic Cataract right Eye Post-operative Diagnosis: same Procedure performed: Extra-capsular Cataract Extraction (ECCE) with placement of a posterior chamber intraocular lens (IOL) right Eye IOL:  Implant Name Type Inv. Item Serial No. Manufacturer Lot No. LRB No. Used  acrysof toric lens     [id number]     Right 1   Anesthesia: 2% Lidocaine and 4% Marcaine in a 50/50 mixture with 10 unites/ml of Hylenex given as a peribulbar Anesthesiologist: Anesthesiologist: Andria Frames, MD CRNA: Jonna Clark, CRNA Complications: none Estimated Blood Loss: less than 1 ml  Description of procedure:  The patient sat upright and the eyes were anesthetized with topical proparacaine. With the patient fixing at a distant target the 3 o'clock and 9 o'clock positions at the limbus were marked with an sterile indelible surgical marking pen.  The patient was given anesthesia and sedation via intravenous access. The patient was then prepped and draped in the usual fashion. A 25-gauge needle was bent to use for starting the capsulorhexis. A 5-0 silk suture was placed through the conjunctiva superior and inferiorly to serve as bridle sutures.   The [place] system was engaged and registration was performed. The positions of the incisions and the steep axis of the astigmatism were identified by the [place] system and marked with an indelible pen. The Verion heads up display was turned off.  Hemostasis was obtained at the the position of the main incision using an eraser cautery. A partial thickness groove was made at the at that location with a 64 Beaver blade and this was dissected anteriorly with an Avaya. The anterior chamber was entered at 10 o'clock with a 1.0 mm paracentesis knife and through the lamellar dissection with a 2.6 mm Alcon keratome. Epi-Shugarcaine 0.5 CC [9 cc BSS Plus (Alcon), 3  cc 4% preservative-free lidocaine (Hospira) and 4 cc 1:1000 preservative-free, bisulfite-free epinephrine] was injected into the anterior chamber via the paracentesis tract. DiscoVisc was injected to replace the aqueous and a continuous tear curvilinear capsulorhexis was performed using a bent 25-gauge needle.  Balance salt on a syringe was used to perform hydro-dissection and phacoemulsification was carried out using a divide and conquer technique. Procedure(s) with comments: CATARACT EXTRACTION PHACO AND INTRAOCULAR LENS PLACEMENT ([facility]) (Right) - [place] 2:12 AP% 59.5 CDE 65.14 fluid pack lot [id number]. Irrigation/aspiration was used to remove the residual cortex and the capsular bag was inflated with DiscoVisc. The intraocular lens was inserted into the capsular bag using a Monarch Delivery System cartridge. The Verion heads up display was turned on and the lens was rotated so that the marks on the base of the haptics were aligned with the steep axis of astigmatism as identified by the [place] unit.   Irrigation/aspiration was used to remove the residual DiscoVisc. The I/A hand piece was pressed down on top of the lens to prevent rotation. The wound was inflated with balanced salt and checked for leaks. None were found. The position of the Toric lens was reconfirmed using the [place] unit. Miostat was injected via the paracentesis track and 0.1 ml of cefuroxime containing 1 mg of drug was injected via the paracentesis track. The wound was checked for leaks again and none were found.   The bridal sutures were removed and two drops of Vigamox were placed on the eye. An eye shield was placed to protect the eye and the patient was discharged to the recovery area in good condition.  Lakia Gritton MD @T @

## 2015-02-01 NOTE — Anesthesia Preprocedure Evaluation (Signed)
Anesthesia Evaluation  Patient identified by MRN, date of birth, ID band Patient awake    Reviewed: Allergy & Precautions, H&P , NPO status , Patient's Chart, lab work & pertinent test results  History of Anesthesia Complications Negative for: history of anesthetic complications  Airway Mallampati: II  TM Distance: >3 FB Neck ROM: Limited    Dental no notable dental hx. (+) Teeth Intact   Pulmonary shortness of breath and with exertion,    Pulmonary exam normal breath sounds clear to auscultation       Cardiovascular Exercise Tolerance: Good (-) angina(-) Past MI and (-) DOE Normal cardiovascular exam+ dysrhythmias  Rhythm:regular Rate:Normal     Neuro/Psych negative neurological ROS  negative psych ROS   GI/Hepatic Neg liver ROS, GERD  Controlled,  Endo/Other  negative endocrine ROS  Renal/GU negative Renal ROS  negative genitourinary   Musculoskeletal   Abdominal (+)  Abdomen: soft.    Peds  Hematology negative hematology ROS (+)   Anesthesia Other Findings Past Medical History:   Shortness of breath dyspnea                                  Dysrhythmia                                                  GERD (gastroesophageal reflux disease)                       Cancer (HCC)                                                   Comment:skin  Past Surgical History:   ABDOMINAL HYSTERECTOMY                                        ARM WOUND REPAIR / CLOSURE                                    MOHS SURGERY                                                    Comment:ear   CATARACT EXTRACTION W/PHACO                     Left 12/07/2014     Comment:Procedure: CATARACT EXTRACTION PHACO AND               INTRAOCULAR LENS PLACEMENT ([place]);  Surgeon:               Estill Cotta, MD;  Location: [facility] ORS;                Service: Ophthalmology;  Laterality: Left;                 FH:[id number] H us:01:14.8 AP%:24.1  CDE:32.29   OPEN REDUCTION INTERNAL FIXATION (ORIF) FOOT L*              BMI    Body Mass Index   25.12 kg/m 2      Reproductive/Obstetrics negative OB ROS                             Anesthesia Physical  Anesthesia Plan  ASA: III  Anesthesia Plan: MAC   Post-op Pain Management:    Induction: Intravenous  Airway Management Planned: Nasal Cannula  Additional Equipment:   Intra-op Plan:   Post-operative Plan:   Informed Consent: I have reviewed the patients History and Physical, chart, labs and discussed the procedure including the risks, benefits and alternatives for the proposed anesthesia with the patient or authorized representative who has indicated his/her understanding and acceptance.   Dental Advisory Given  Plan Discussed with: CRNA, Anesthesiologist and Surgeon  Anesthesia Plan Comments:         Anesthesia Quick Evaluation

## 2015-02-01 NOTE — H&P (Signed)
See scanned note.

## 2015-02-02 ENCOUNTER — Encounter: Payer: Self-pay | Admitting: Ophthalmology

## 2015-02-04 ENCOUNTER — Encounter: Payer: Self-pay | Admitting: Ophthalmology

## 2015-04-21 ENCOUNTER — Other Ambulatory Visit: Payer: Self-pay | Admitting: Internal Medicine

## 2015-05-11 ENCOUNTER — Ambulatory Visit (INDEPENDENT_AMBULATORY_CARE_PROVIDER_SITE_OTHER): Payer: 59 | Admitting: Primary Care

## 2015-05-11 ENCOUNTER — Encounter: Payer: Self-pay | Admitting: Internal Medicine

## 2015-05-11 ENCOUNTER — Encounter: Payer: Self-pay | Admitting: Primary Care

## 2015-05-11 VITALS — BP 134/82 | HR 54 | Temp 98.2°F | Ht 65.0 in | Wt 156.0 lb

## 2015-05-11 DIAGNOSIS — Z1211 Encounter for screening for malignant neoplasm of colon: Secondary | ICD-10-CM

## 2015-05-11 DIAGNOSIS — K219 Gastro-esophageal reflux disease without esophagitis: Secondary | ICD-10-CM | POA: Diagnosis not present

## 2015-05-11 DIAGNOSIS — J309 Allergic rhinitis, unspecified: Secondary | ICD-10-CM

## 2015-05-11 DIAGNOSIS — Z1239 Encounter for other screening for malignant neoplasm of breast: Secondary | ICD-10-CM

## 2015-05-11 NOTE — Progress Notes (Signed)
Subjective:    Patient ID: Anna Liu, female    DOB: Jun 30, 1950, 65 y.o.   MRN: [id number]  HPI  Anna Liu is a 65 year old female who presents today to establish care and discuss the problems mentioned below. Will obtain old records. She's not had a physical in numerous years.   1) GERD: Present for numerous years. Intermittent. Will experience esophageal burning. She's been drinking apple cider vinegar for her symptoms with improvement. Is not managed on Tums, H2 blocker, or PPI. Denies endoscopy.  2) Allergic Rhinitis: Post nasal drip and rhinorrhea occasionally. She is not managed on a daily antihistamine or steriodal nasal spray. Symptoms are not bothersome for the most part. Will experience shortness of breath occasionally with heavy exertion (walking up and down her stairs multiple times in a row). Denies history of asthma. Never a smoker.  3) General Health: Multiple questions today about overall health as she's not seen a PCP in years. She would like a screening mammogram as she's not completed one in 25 years. She's not had a colonoscopy and would like to be set up for this as well. Denies FH of breast and colon cancer.  Review of Systems  Constitutional: Negative for unexpected weight change.  HENT: Positive for postnasal drip.   Respiratory: Negative for shortness of breath and wheezing.   Cardiovascular: Negative for chest pain.  Gastrointestinal: Negative for blood in stool.  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 Removed in 2015.  Neurological: Negative for dizziness and headaches.       Past Medical History  Diagnosis Date  . Shortness of breath dyspnea   . Dysrhythmia   . GERD (gastroesophageal reflux disease)   . Cancer [facility])     skin    Social History   Social History  . Marital Status: Divorced    Spouse Name: N/A  . Number of Children: N/A  . Years of Education: N/A    Occupational History  . Not on file.   Social History Main Topics  . Smoking status: Never Smoker   . Smokeless tobacco: Not on file  . Alcohol Use: No  . Drug Use: No  . Sexual Activity: Not on file   Other Topics Concern  . Not on file   Social History Narrative   Divorced.   Highest level of education 12th grade.   Works as a Geophysical data processor.    Enjoys reading, watching movies.     Past Surgical History  Procedure Laterality Date  . Abdominal hysterectomy    . Arm wound repair / closure    . Mohs surgery      ear  . Cataract extraction w/phaco Left 12/07/2014    Procedure: CATARACT EXTRACTION PHACO AND INTRAOCULAR LENS PLACEMENT (IOC);  Surgeon: Estill Cotta, MD;  Location: [facility] ORS;  Service: Ophthalmology;  Laterality: Left;  [id number] H us:01:14.8 AP%:24.1 CDE:32.29  . Open reduction internal fixation (orif) foot lisfranc fracture    . Cataract extraction w/phaco Right 02/01/2015    Procedure: CATARACT EXTRACTION PHACO AND INTRAOCULAR LENS PLACEMENT (IOC);  Surgeon: Estill Cotta, MD;  Location: [facility] ORS;  Service: Ophthalmology;  Laterality: Right;  [place] 2:12 AP% 59.5 CDE 65.14 fluid pack lot [id number]    Family History  Problem Relation Age of Onset  . Other      Adopted    No Known Allergies  No  current outpatient prescriptions on file prior to visit.   No current facility-administered medications on file prior to visit.    BP 134/82 mmHg  Pulse 54  Temp(Src) 98.2 F (36.8 C) (Oral)  Ht 5\' 5"  (1.651 m)  Wt 156 lb (70.761 kg)  BMI 25.96 kg/m2  SpO2 99%    Objective:   Physical Exam  Constitutional: She is oriented to person, place, and time. She appears well-nourished.  Neck: Neck supple.  Cardiovascular: Normal rate and regular rhythm.   Pulmonary/Chest: Effort normal and breath sounds normal.  Neurological: She is alert and oriented to person, place, and time.  Skin: Skin is warm and dry.  Psychiatric: She has a normal mood  and affect.          Assessment & Plan:  General Health:  Spent a long time discussing guidelines and recommendations for general health. She plans on scheduling a fasting physical in the near future as she's not had one in numerous years. Mammogram orders placed into Epic, patient to schedule. Referral placed to GI for colonoscopy. Will complete full physical at her convenience.

## 2015-05-11 NOTE — Patient Instructions (Signed)
You will be contacted regarding your referral to GI regarding your colonoscopy.  Please let us know if you have not heard back within one week.   Schedule your mammogram at your convenience.   Please schedule a physical with me soon at your convenience. You may also schedule a lab only appointment 3-4 days prior. We will discuss your lab results in detail during your physical.  For your drainage, you can try taking a daily antihistamine such as Claritin.   It was a pleasure to meet you today! Please don't hesitate to call me with any questions. Welcome to [facility]!

## 2015-05-11 NOTE — Assessment & Plan Note (Signed)
Post nasal drip and rhinorrhea occasionally. Not on antihistamine or OTC treatment. Suggested daily Claritin.

## 2015-05-11 NOTE — Progress Notes (Signed)
Pre visit review using our clinic review tool, if applicable. No additional management support is needed unless otherwise documented below in the visit note. 

## 2015-05-11 NOTE — Assessment & Plan Note (Signed)
History of for years, intermittent.  Not currently on medication. Uses apple cider vinegar PRN for symptoms with improvement. She's aware of her triggers. Will continue to monitor.

## 2015-05-13 ENCOUNTER — Other Ambulatory Visit: Payer: Self-pay | Admitting: Primary Care

## 2015-05-13 DIAGNOSIS — Z0000 Encounter for general adult medical examination without abnormal findings: Secondary | ICD-10-CM

## 2015-05-14 ENCOUNTER — Other Ambulatory Visit (INDEPENDENT_AMBULATORY_CARE_PROVIDER_SITE_OTHER): Payer: 59

## 2015-05-14 DIAGNOSIS — Z0000 Encounter for general adult medical examination without abnormal findings: Secondary | ICD-10-CM

## 2015-05-15 LAB — COMPREHENSIVE METABOLIC PANEL
ALT: 22 IU/L (ref 0–32)
AST: 23 IU/L (ref 0–40)
Albumin/Globulin Ratio: 2.5 — ABNORMAL HIGH (ref 1.2–2.2)
Albumin: 4.7 g/dL (ref 3.6–4.8)
Alkaline Phosphatase: 72 IU/L (ref 39–117)
BUN / CREAT RATIO: 15 (ref 11–26)
BUN: 11 mg/dL (ref 8–27)
Bilirubin Total: 0.5 mg/dL (ref 0.0–1.2)
CALCIUM: 9.1 mg/dL (ref 8.7–10.3)
CO2: 23 mmol/L (ref 18–29)
Chloride: 102 mmol/L (ref 96–106)
Creatinine, Ser: 0.72 mg/dL (ref 0.57–1.00)
GFR, EST AFRICAN AMERICAN: 102 mL/min/{1.73_m2} (ref >59)
GFR, EST NON AFRICAN AMERICAN: 89 mL/min/{1.73_m2} (ref >59)
GLOBULIN, TOTAL: 1.9 g/dL (ref 1.5–4.5)
Glucose: 90 mg/dL (ref 65–99)
POTASSIUM: 4.1 mmol/L (ref 3.5–5.2)
SODIUM: 141 mmol/L (ref 134–144)
TOTAL PROTEIN: 6.6 g/dL (ref 6.0–8.5)

## 2015-05-15 LAB — CBC WITH DIFFERENTIAL/PLATELET
BASOS: 1 %
Basophils Absolute: 0.1 10*3/uL (ref 0.0–0.2)
EOS (ABSOLUTE): 0 10*3/uL (ref 0.0–0.4)
EOS: 1 %
HEMATOCRIT: 37.1 % (ref 34.0–46.6)
Hemoglobin: 12.3 g/dL (ref 11.1–15.9)
IMMATURE GRANS (ABS): 0 10*3/uL (ref 0.0–0.1)
IMMATURE GRANULOCYTES: 0 %
LYMPHS: 32 %
Lymphocytes Absolute: 2 10*3/uL (ref 0.7–3.1)
MCH: 29.3 pg (ref 26.6–33.0)
MCHC: 33.2 g/dL (ref 31.5–35.7)
MCV: 88 fL (ref 79–97)
MONOS ABS: 0.5 10*3/uL (ref 0.1–0.9)
Monocytes: 8 %
NEUTROS ABS: 3.6 10*3/uL (ref 1.4–7.0)
NEUTROS PCT: 58 %
Platelets: 250 10*3/uL (ref 150–379)
RBC: 4.2 x10E6/uL (ref 3.77–5.28)
RDW: 13.6 % (ref 12.3–15.4)
WBC: 6.3 10*3/uL (ref 3.4–10.8)

## 2015-05-15 LAB — LIPID PANEL
CHOL/HDL RATIO: 3 ratio (ref 0.0–4.4)
Cholesterol, Total: 254 mg/dL — ABNORMAL HIGH (ref 100–199)
HDL: 85 mg/dL (ref >39)
LDL CALC: 148 mg/dL — AB (ref 0–99)
Triglycerides: 107 mg/dL (ref 0–149)
VLDL CHOLESTEROL CAL: 21 mg/dL (ref 5–40)

## 2015-05-15 LAB — HEMOGLOBIN A1C
Est. average glucose Bld gHb Est-mCnc: 126 mg/dL
Hgb A1c MFr Bld: 6 % — ABNORMAL HIGH (ref 4.8–5.6)

## 2015-05-15 LAB — TSH: TSH: 1.39 u[IU]/mL (ref 0.450–4.500)

## 2015-05-15 LAB — VITAMIN D 25 HYDROXY (VIT D DEFICIENCY, FRACTURES): VIT D 25 HYDROXY: 9.5 ng/mL — AB (ref 30.0–100.0)

## 2015-05-19 ENCOUNTER — Encounter: Payer: Self-pay | Admitting: Primary Care

## 2015-05-19 ENCOUNTER — Ambulatory Visit (INDEPENDENT_AMBULATORY_CARE_PROVIDER_SITE_OTHER): Payer: 59 | Admitting: Primary Care

## 2015-05-19 VITALS — BP 128/84 | HR 73 | Temp 98.3°F | Ht 65.0 in | Wt 152.1 lb

## 2015-05-19 DIAGNOSIS — E2839 Other primary ovarian failure: Secondary | ICD-10-CM

## 2015-05-19 DIAGNOSIS — E785 Hyperlipidemia, unspecified: Secondary | ICD-10-CM

## 2015-05-19 DIAGNOSIS — E559 Vitamin D deficiency, unspecified: Secondary | ICD-10-CM | POA: Diagnosis not present

## 2015-05-19 DIAGNOSIS — Z0000 Encounter for general adult medical examination without abnormal findings: Secondary | ICD-10-CM

## 2015-05-19 DIAGNOSIS — R7303 Prediabetes: Secondary | ICD-10-CM | POA: Diagnosis not present

## 2015-05-19 DIAGNOSIS — Z23 Encounter for immunization: Secondary | ICD-10-CM

## 2015-05-19 MED ORDER — ZOSTER VACCINE LIVE 19400 UNT/0.65ML ~~LOC~~ SOLR: 0.6500 mL | Freq: Once | SUBCUTANEOUS | Status: DC

## 2015-05-19 MED ORDER — VITAMIN D (ERGOCALCIFEROL) 1.25 MG (50000 UNIT) PO CAPS: ORAL_CAPSULE | ORAL | Status: AC

## 2015-05-19 NOTE — Assessment & Plan Note (Signed)
A1C of 6.0 on recent labs. Discussed low carb, low sugar diet. She is currently working on improving diet and exercise. Will recheck in 3 months.

## 2015-05-19 NOTE — Assessment & Plan Note (Signed)
TC and LDL slightly above goal. She is working on improvements in diet and exercise. Will continue to monitor and repeat in 3-6 months.

## 2015-05-19 NOTE — Addendum Note (Signed)
Addended by: Jacqualin Combes on: 05/19/2015 12:39 PM   Modules accepted: Orders, SmartSet

## 2015-05-19 NOTE — Assessment & Plan Note (Signed)
Td due and provided today. Declines Flu. Shingles due and provided today. Pneumonia due next year. Mammogram, Colonoscopy, and bone density testing pending. Had complete hysterectomy in past, no need for PAP. Discussed the importance of a healthy diet and regular exercise in order for weight loss and to reduce risk of other medical diseases.  Labs with hyperlipidemia, borderline diabetes, and vitamin D def that are discussed elsewhere. Exam unremarkable.  Follow up in 1 year for repeat physical.

## 2015-05-19 NOTE — Progress Notes (Signed)
Subjective:    Patient ID: Jeanmarie Plant, female    DOB: [birth year], 65 y.o.   MRN: [id number]  HPI  Ms. Tejero is a 65 year old female who presents today for complete physical.  Immunizations: -Tetanus: Unsure, believes she received in 2006. Due. -Influenza: Did not receive last season. -Pneumonia: Never received -Shingles: Never received. Due.   Diet: She endorses a healthier diet. Breakfast: Oatmeal Lunch: Skips Dinner:Grilled chicken, salad, baked or sweet potato, tuna Snacks: Fruit Desserts: None Beverages: Water  Exercise: She is not currently exercising. Eye exam: Completed in 2017 Dental exam: Completed in Winter 2016 Colonoscopy: Scheduled Dexa: Never completed  Pap Smear: Hysterectomy Mammogram: Scheduled   Review of Systems  Constitutional: Negative for unexpected weight change.  Respiratory: Negative for cough and shortness of breath.   Cardiovascular: Negative for chest pain.  Gastrointestinal: Negative for diarrhea and constipation.  Genitourinary: Negative for difficulty urinating.       Hysterectomy   Musculoskeletal: Negative for myalgias and arthralgias.  Skin: Negative for rash.  Allergic/Immunologic: Positive for environmental allergies.  Neurological: Negative for dizziness, numbness and headaches.  Psychiatric/Behavioral:       Denies concerns for anxiety or depression       Past Medical History  Diagnosis Date  . Shortness of breath dyspnea   . Dysrhythmia   . GERD (gastroesophageal reflux disease)   . Cancer [facility])     skin    Social History   Social History  . Marital Status: Divorced    Spouse Name: N/A  . Number of Children: N/A  . Years of Education: N/A   Occupational History  . Not on file.   Social History Main Topics  . Smoking status: Never Smoker   . Smokeless tobacco: Not on file  . Alcohol Use: No  . Drug Use: No  . Sexual Activity: Not on file   Other Topics Concern  . Not on file   Social History  Narrative   Divorced.   Highest level of education 12th grade.   Works as a Geophysical data processor.    Enjoys reading, watching movies.     Past Surgical History  Procedure Laterality Date  . Abdominal hysterectomy    . Arm wound repair / closure    . Mohs surgery      ear  . Cataract extraction w/phaco Left 12/07/2014    Procedure: CATARACT EXTRACTION PHACO AND INTRAOCULAR LENS PLACEMENT (IOC);  Surgeon: Estill Cotta, MD;  Location: [facility] ORS;  Service: Ophthalmology;  Laterality: Left;  [id number] H us:01:14.8 AP%:24.1 CDE:32.29  . Open reduction internal fixation (orif) foot lisfranc fracture    . Cataract extraction w/phaco Right 02/01/2015    Procedure: CATARACT EXTRACTION PHACO AND INTRAOCULAR LENS PLACEMENT (IOC);  Surgeon: Estill Cotta, MD;  Location: [facility] ORS;  Service: Ophthalmology;  Laterality: Right;  [place] 2:12 AP% 59.5 CDE 65.14 fluid pack lot [id number] H    Family History  Problem Relation Age of Onset  . Other      Adopted    No Known Allergies  No current outpatient prescriptions on file prior to visit.   No current facility-administered medications on file prior to visit.    BP 128/84 mmHg  Pulse 73  Temp(Src) 98.3 F (36.8 C) (Oral)  Ht 5\' 5"  (1.651 m)  Wt 152 lb 1.9 oz (69.001 kg)  BMI 25.31 kg/m2  SpO2 98%    Objective:   Physical Exam  Constitutional: She is oriented to person, place, and time. She appears well-nourished.  HENT:  Right Ear: Tympanic membrane and ear canal normal.  Left Ear: Tympanic membrane and ear canal normal.  Nose: Nose normal.  Mouth/Throat: Oropharynx is clear and moist.  Eyes: Conjunctivae and EOM are normal. Pupils are equal, round, and reactive to light.  Neck: Neck supple. No thyromegaly present.  Cardiovascular: Normal rate and regular rhythm.   No murmur heard. Pulmonary/Chest: Effort normal and breath sounds normal. She has no rales.  Abdominal: Soft. Bowel sounds are normal. There is no tenderness.    Musculoskeletal: Normal range of motion.  Lymphadenopathy:    She has no cervical adenopathy.  Neurological: She is alert and oriented to person, place, and time. She has normal reflexes. No cranial nerve deficit.  Skin: Skin is warm and dry. No rash noted.  Psychiatric: She has a normal mood and affect.          Assessment & Plan:

## 2015-05-19 NOTE — Assessment & Plan Note (Signed)
Level of 9.5 on recent labs. Start vitamin D 50,000 unit capsules once weekly x 12 weeks. Will recheck in 3 months.

## 2015-05-19 NOTE — Patient Instructions (Signed)
Start Vitamin D capsules. Take 1 capsule by mouth once weekly for a total of 12 weeks.  Call your insurance company regarding the shingles vaccination as discussed.  You will be contacted regarding scheduling your bone density testing.  Please let us know if you have not heard back within one week.   Continue to work on your diet and start exercising as discussed.   Schedule a lab only appointment in 3 months for repeat labs (diabetes test, cholesterol, and vitamin D) as discussed.  Follow up in 1 year for repeat physical or sooner if needed.  It was a pleasure to see you today!

## 2015-05-20 ENCOUNTER — Ambulatory Visit
Admission: RE | Admit: 2015-05-20 | Discharge: 2015-05-20 | Disposition: A | Discharge status: Home / Self Care | Payer: 59 | Source: Ambulatory Visit | Attending: Primary Care | Admitting: Primary Care

## 2015-05-20 DIAGNOSIS — Z1231 Encounter for screening mammogram for malignant neoplasm of breast: Secondary | ICD-10-CM | POA: Insufficient documentation

## 2015-05-20 DIAGNOSIS — Z1239 Encounter for other screening for malignant neoplasm of breast: Secondary | ICD-10-CM

## 2015-06-15 ENCOUNTER — Encounter: Payer: Self-pay | Admitting: Internal Medicine

## 2015-06-15 ENCOUNTER — Ambulatory Visit (INDEPENDENT_AMBULATORY_CARE_PROVIDER_SITE_OTHER): Payer: 59 | Admitting: Internal Medicine

## 2015-06-15 VITALS — BP 140/84 | HR 58 | Ht 64.25 in | Wt 150.0 lb

## 2015-06-15 DIAGNOSIS — R635 Abnormal weight gain: Secondary | ICD-10-CM

## 2015-06-15 DIAGNOSIS — Z1211 Encounter for screening for malignant neoplasm of colon: Secondary | ICD-10-CM | POA: Diagnosis not present

## 2015-06-15 DIAGNOSIS — K219 Gastro-esophageal reflux disease without esophagitis: Secondary | ICD-10-CM

## 2015-06-15 MED ORDER — NA SULFATE-K SULFATE-MG SULF 17.5-3.13-1.6 GM/177ML PO SOLN: 1.0000 | Freq: Once | ORAL | Status: DC

## 2015-06-15 NOTE — Patient Instructions (Signed)

## 2015-06-15 NOTE — Progress Notes (Signed)
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 The patient is new to this practice. She reports a several year history of intermittent problems with pyrosis and regurgitation. Some belching. No true esophageal dysphagia. Has had 20 pound weight gain over the past 18 months. No abdominal pain. Some vomiting of yellow material with regurgitation. Worse at night. The patient has not had prior GI evaluation. Family history unknown as she is adopted. Encouraged by her insurance company to undergo screening colonoscopy. Occasional irritation from hemorrhoids but otherwise no lower GI complaints.  REVIEW OF SYSTEMS:  All non-GI ROS negative except for sinus allergy, back pain, cough, occasional skipped heartbeat, shortness of breath  Past Medical History  Diagnosis Date  . Shortness of breath dyspnea   . Dysrhythmia   . GERD (gastroesophageal reflux disease)   . Cancer ([facility])     skin  . Hemorrhoids   . Anemia     Past Surgical History  Procedure Laterality Date  . Abdominal hysterectomy    . Arm wound repair / closure    . Mohs surgery      ear  . Cataract extraction w/phaco Left 12/07/2014    Procedure: CATARACT EXTRACTION PHACO AND INTRAOCULAR LENS PLACEMENT (IOC);  Surgeon: Estill Cotta, MD;  Location: [facility] ORS;  Service: Ophthalmology;  Laterality: Left;  [id number] H us:01:14.8 AP%:24.1 CDE:32.29  . Open reduction internal fixation (orif) foot lisfranc fracture    . Cataract extraction w/phaco Right 02/01/2015    Procedure: CATARACT EXTRACTION PHACO AND INTRAOCULAR LENS PLACEMENT (IOC);  Surgeon: Estill Cotta, MD;  Location: [facility] ORS;  Service: Ophthalmology;  Laterality: Right;  [place] 2:12 AP% 59.5 CDE 65.14 fluid pack lot [id number] H    Social History Anna Liu  reports that she has never smoked. She does  not have any smokeless tobacco history on file. She reports that she does not drink alcohol or use illicit drugs.  family history is not on file. She was adopted.  No Known Allergies     PHYSICAL EXAMINATION: Vital signs: BP 140/84 mmHg  Pulse 58  Ht 5' 4.25" (1.632 m)  Wt 150 lb (68.04 kg)  BMI 25.55 kg/m2  Constitutional: generally well-appearing, no acute distress Psychiatric: alert and oriented x3, cooperative Eyes: extraocular movements intact, anicteric, conjunctiva pink Mouth: oral pharynx moist, no lesions Neck: supple no lymphadenopathy Cardiovascular: heart regular rate and rhythm, no murmur Lungs: clear to auscultation bilaterally Abdomen: soft, nontender, nondistended, no obvious ascites, no peritoneal signs, normal bowel sounds, no organomegaly Rectal:Deferred until colonoscopy Extremities: no clubbing cyanosis or lower extremity edema bilaterally Skin: no lesions on visible extremities Neuro: No focal deficits. Normal DTRs. Cranial nerves intact No asterixis.    ASSESSMENT:  #1. Chronic GERD. #2. Weight gain #3. Colon cancer screening. Baseline risk. Appropriate candidate without contraindication   PLAN:  #1. Reflux precautions with attention to weight loss. Reviewed #2. Schedule upper endoscopy to evaluate reflux symptoms.The nature of the procedure, as well as the risks, benefits, and alternatives were carefully and thoroughly reviewed with the patient. Ample time for discussion and questions allowed. The patient understood, was satisfied, and agreed to proceed. May need PPI if active inflammation present. #3. Screening colonoscopy.The nature of the procedure, as well as the risks, benefits, and alternatives were carefully and thoroughly reviewed with the patient. Ample time for discussion and questions allowed. The patient understood, was  satisfied, and agreed to proceed.   A copy of this consultation note has been sent to Alma Friendly, NP

## 2015-06-16 ENCOUNTER — Telehealth: Payer: Self-pay | Admitting: Internal Medicine

## 2015-06-16 NOTE — Telephone Encounter (Signed)
Spoke with pt and reviewed prep instructions over the phone and answered her questions.

## 2015-06-17 ENCOUNTER — Ambulatory Visit
Admission: RE | Admit: 2015-06-17 | Discharge: 2015-06-17 | Disposition: A | Discharge status: Home / Self Care | Payer: 59 | Source: Ambulatory Visit | Attending: Primary Care | Admitting: Primary Care

## 2015-06-17 DIAGNOSIS — E2839 Other primary ovarian failure: Secondary | ICD-10-CM

## 2015-06-17 DIAGNOSIS — M85851 Other specified disorders of bone density and structure, right thigh: Secondary | ICD-10-CM | POA: Insufficient documentation

## 2015-06-18 ENCOUNTER — Telehealth: Payer: Self-pay | Admitting: Primary Care

## 2015-06-18 NOTE — Telephone Encounter (Signed)
Pt returned your call.  

## 2015-06-21 NOTE — Telephone Encounter (Signed)
Called and notified patient of Kate's comments on 06/18/2015. Patient verbalized understanding.

## 2015-08-03 ENCOUNTER — Telehealth: Payer: Self-pay

## 2015-08-03 NOTE — Telephone Encounter (Signed)
Please notify patient that she does not need a refill of this medication at this time. I would like to obtain her most recent vitamin D level prior to any additional refills.

## 2015-08-03 NOTE — Telephone Encounter (Signed)
Pt is currently taking Vit D 50,000 weekly. She has 1 left for this Friday. She has labs on the 21st of this month. She is asking if she needs another rx to keep taking ituntil her labs are done. Please call patient and let her know.

## 2015-08-04 ENCOUNTER — Encounter: Payer: Self-pay | Admitting: Internal Medicine

## 2015-08-04 NOTE — Telephone Encounter (Signed)
Lm on pts vm and advised per [facility]. Requested pt contact office and schedule lab appt for repeat VitD

## 2015-08-13 ENCOUNTER — Other Ambulatory Visit (INDEPENDENT_AMBULATORY_CARE_PROVIDER_SITE_OTHER): Payer: 59

## 2015-08-13 DIAGNOSIS — E785 Hyperlipidemia, unspecified: Secondary | ICD-10-CM

## 2015-08-13 DIAGNOSIS — R7303 Prediabetes: Secondary | ICD-10-CM | POA: Diagnosis not present

## 2015-08-13 DIAGNOSIS — E559 Vitamin D deficiency, unspecified: Secondary | ICD-10-CM

## 2015-08-14 LAB — HEMOGLOBIN A1C
Est. average glucose Bld gHb Est-mCnc: 108 mg/dL
HEMOGLOBIN A1C: 5.4 % (ref 4.8–5.6)

## 2015-08-14 LAB — LIPID PANEL
CHOL/HDL RATIO: 2.8 ratio (ref 0.0–4.4)
CHOLESTEROL TOTAL: 236 mg/dL — AB (ref 100–199)
HDL: 85 mg/dL (ref >39)
LDL CALC: 131 mg/dL — AB (ref 0–99)
Triglycerides: 102 mg/dL (ref 0–149)
VLDL CHOLESTEROL CAL: 20 mg/dL (ref 5–40)

## 2015-08-14 LAB — VITAMIN D 25 HYDROXY (VIT D DEFICIENCY, FRACTURES): Vit D, 25-Hydroxy: 32.9 ng/mL (ref 30.0–100.0)

## 2015-08-16 ENCOUNTER — Encounter: Payer: Self-pay | Admitting: *Deleted

## 2015-08-18 ENCOUNTER — Encounter: Payer: Self-pay | Admitting: Internal Medicine

## 2015-08-18 ENCOUNTER — Ambulatory Visit (AMBULATORY_SURGERY_CENTER): Payer: 59 | Admitting: Internal Medicine

## 2015-08-18 VITALS — BP 149/57 | HR 59 | Temp 98.7°F | Resp 12 | Ht 64.5 in | Wt 150.0 lb

## 2015-08-18 DIAGNOSIS — D125 Benign neoplasm of sigmoid colon: Secondary | ICD-10-CM

## 2015-08-18 DIAGNOSIS — D122 Benign neoplasm of ascending colon: Secondary | ICD-10-CM | POA: Diagnosis not present

## 2015-08-18 DIAGNOSIS — Z1211 Encounter for screening for malignant neoplasm of colon: Secondary | ICD-10-CM | POA: Diagnosis not present

## 2015-08-18 DIAGNOSIS — K219 Gastro-esophageal reflux disease without esophagitis: Secondary | ICD-10-CM

## 2015-08-18 DIAGNOSIS — K2100 Gastro-esophageal reflux disease with esophagitis, without bleeding: Secondary | ICD-10-CM

## 2015-08-18 MED ORDER — SODIUM CHLORIDE 0.9 % IV SOLN: 500.0000 mL | INTRAVENOUS | Status: DC

## 2015-08-18 MED ORDER — OMEPRAZOLE 40 MG PO CPDR: 40.0000 mg | DELAYED_RELEASE_CAPSULE | Freq: Every day | ORAL | Status: DC

## 2015-08-18 NOTE — Progress Notes (Signed)
Called to room to assist during endoscopic procedure.  Patient ID and intended procedure confirmed with present staff. Received instructions for my participation in the procedure from the performing physician.  

## 2015-08-18 NOTE — Op Note (Addendum)
[place] Patient Name: Anna Liu Procedure Date: 08/18/2015 10:50 AM MRN: [id number] Endoscopist: Docia Chuck. Henrene Pastor , MD Age: 65 Referring MD:  Date of Birth: 07/02/1950 Gender: Female Account #: [id number] Procedure:                Colonoscopy, with cold snare polypectomy X2 Indications:              Screening for colorectal malignant neoplasm Medicines:                Monitored Anesthesia Care Procedure:                Pre-Anesthesia Assessment:                           - Prior to the procedure, a History and Physical                            was performed, and patient medications and                            allergies were reviewed. The patient's tolerance of                            previous anesthesia was also reviewed. The risks                            and benefits of the procedure and the sedation                            options and risks were discussed with the patient.                            All questions were answered, and informed consent                            was obtained. Prior Anticoagulants: The patient has                            taken no previous anticoagulant or antiplatelet                            agents. ASA Grade Assessment: II - A patient with                            mild systemic disease. After reviewing the risks                            and benefits, the patient was deemed in                            satisfactory condition to undergo the procedure.                           After obtaining informed consent, the colonoscope  was passed under direct vision. Throughout the                            procedure, the patient's blood pressure, pulse, and                            oxygen saturations were monitored continuously. The                            Model CF-HQ190L [id number]) scope was introduced                            through the anus and advanced to the the cecum,                    identified by appendiceal orifice and ileocecal                            valve. The ileocecal valve, appendiceal orifice,                            and rectum were photographed. The quality of the                            bowel preparation was excellent. The colonoscopy                            was performed without difficulty. The patient                            tolerated the procedure well. The bowel preparation                            used was SUPREP. Scope In: 10:53:50 AM Scope Out: 11:09:23 AM Scope Withdrawal Time: 0 hours 12 minutes 43 seconds  Total Procedure Duration: 0 hours 15 minutes 33 seconds  Findings:                 A 8 mm polyp was found in the ascending colon. The                            polyp was sessile. The polyp was removed with a                            cold snare. Resection and retrieval were complete.                           A 8 mm polyp was found in the sigmoid colon. The                            polyp was pedunculated. The polyp was removed with                            a cold snare. Resection and retrieval were complete.  Internal hemorrhoids were found during retroflexion.                           The exam was otherwise without abnormality on                            direct and retroflexion views. Complications:            No immediate complications. Estimated blood loss:                            None. Estimated Blood Loss:     Estimated blood loss: none. Impression:               - Two 8 mm polyps, removed with a cold snare.                            Resected and retrieved.                           - Internal hemorrhoids.                           - The examination was otherwise normal on direct                            and retroflexion views. Recommendation:           - Repeat colonoscopy in 5 years for surveillance.                           - Await pathology results. Docia Chuck.  Henrene Pastor, MD 08/18/2015 11:15:42 AM This report has been signed electronically.

## 2015-08-18 NOTE — Patient Instructions (Addendum)

## 2015-08-18 NOTE — Progress Notes (Signed)
Teeth unchanged after procedure.A and O x3. Report to RN. Tolerated MAC anesthesia well. 

## 2015-08-18 NOTE — Op Note (Addendum)
[facility] Patient Name: Anna Liu Procedure Date: 08/18/2015 11:12 AM MRN: [id number] Endoscopist: Docia Chuck. Henrene Pastor , MD Age: 65 Referring MD:  Date of Birth: [birth year] Gender: Female Account #: [id number] Procedure:                Upper GI endoscopy, with biopsies Indications:              Suspected esophageal reflux Medicines:                Monitored Anesthesia Care Procedure:                Pre-Anesthesia Assessment:                           - Prior to the procedure, a History and Physical                            was performed, and patient medications and                            allergies were reviewed. The patient's tolerance of                            previous anesthesia was also reviewed. The risks                            and benefits of the procedure and the sedation                            options and risks were discussed with the patient.                            All questions were answered, and informed consent                            was obtained. Prior Anticoagulants: The patient has                            taken no previous anticoagulant or antiplatelet                            agents. ASA Grade Assessment: II - A patient with                            mild systemic disease. After reviewing the risks                            and benefits, the patient was deemed in                            satisfactory condition to undergo the procedure.                           - Prior to the procedure, a History and Physical  was performed, and patient medications and                            allergies were reviewed. The patient's tolerance of                            previous anesthesia was also reviewed. The risks                            and benefits of the procedure and the sedation                            options and risks were discussed with the patient.                            All questions  were answered, and informed consent                            was obtained. Prior Anticoagulants: The patient has                            taken no previous anticoagulant or antiplatelet                            agents. ASA Grade Assessment: II - A patient with                            mild systemic disease. After reviewing the risks                            and benefits, the patient was deemed in                            satisfactory condition to undergo the procedure.                           After obtaining informed consent, the endoscope was                            passed under direct vision. Throughout the                            procedure, the patient's blood pressure, pulse, and                            oxygen saturations were monitored continuously. The                            Model [id number]) scope was introduced                            through the mouth, and advanced to the second part  of duodenum. The upper GI endoscopy was                            accomplished without difficulty. The patient                            tolerated the procedure well. Scope In: Scope Out: Findings:                 LA Grade C (one or more mucosal breaks continuous                            between tops of 2 or more mucosal folds, less than                            75% circumference) esophagitis was found. There was                            slight fullness or nodularity at the                            gastroesophageal junction. Biopsies were taken with                            a cold forceps for histology, from this area.                           The exam of the esophagus was otherwise normal. No                            Barrett's by endoscopic definition.                           The entire examined stomach was normal.                           The examined duodenum was normal.                           The cardia and  gastric fundus were normal on                            retroflexion. Complications:            No immediate complications. Estimated Blood Loss:     Estimated blood loss: none. Impression:               - LA Grade C reflux esophagitis. Biopsied full or                            nodular area.                           - Normal stomach.                           - Normal  examined duodenum. Recommendation:           - Prescribed omeprazole 40 mg daily; #30; 11                            refills.                           - Await pathology results.                           - Return to GI clinic in 6 weeks for follow-up with                            Dr. Henrene Pastor. Docia Chuck. Henrene Pastor, MD 08/18/2015 11:27:13 AM This report has been signed electronically.

## 2015-08-19 ENCOUNTER — Telehealth: Payer: Self-pay | Admitting: Internal Medicine

## 2015-08-19 ENCOUNTER — Telehealth: Payer: Self-pay | Admitting: *Deleted

## 2015-08-19 MED ORDER — OMEPRAZOLE 20 MG PO CPDR: 20.0000 mg | DELAYED_RELEASE_CAPSULE | Freq: Every day | ORAL | Status: AC

## 2015-08-19 NOTE — Telephone Encounter (Signed)
Message left

## 2015-08-19 NOTE — Telephone Encounter (Signed)
Spoke with pt and she is aware, new script sent to pharmacy. 

## 2015-08-19 NOTE — Telephone Encounter (Signed)
Pt has been online reading side effects of the omeprazole and wants to know if Dr. Henrene Pastor would be ok with her taking the lower dose of 20mg  daily. Please advise.

## 2015-08-19 NOTE — Telephone Encounter (Signed)
Sure

## 2015-08-20 ENCOUNTER — Other Ambulatory Visit: Payer: 59

## 2015-08-24 ENCOUNTER — Encounter: Payer: Self-pay | Admitting: Internal Medicine

## 2015-09-17 ENCOUNTER — Other Ambulatory Visit: Payer: 59

## 2015-10-11 ENCOUNTER — Ambulatory Visit: Payer: 59 | Admitting: Internal Medicine

## 2015-11-29 ENCOUNTER — Ambulatory Visit (INDEPENDENT_AMBULATORY_CARE_PROVIDER_SITE_OTHER): Payer: 59 | Admitting: Internal Medicine

## 2015-11-29 ENCOUNTER — Encounter (INDEPENDENT_AMBULATORY_CARE_PROVIDER_SITE_OTHER): Payer: Self-pay

## 2015-11-29 ENCOUNTER — Encounter: Payer: Self-pay | Admitting: Internal Medicine

## 2015-11-29 VITALS — BP 160/90 | HR 82 | Ht 64.25 in | Wt 145.2 lb

## 2015-11-29 DIAGNOSIS — K2100 Gastro-esophageal reflux disease with esophagitis, without bleeding: Secondary | ICD-10-CM

## 2015-11-29 NOTE — Patient Instructions (Signed)
Follow up in one year.  Continue current medications.  Thank you for choosing  GI  Dr Scarlette Shorts

## 2015-11-29 NOTE — Progress Notes (Signed)
HISTORY OF PRESENT ILLNESS:  Anna Liu is a 65 y.o. female who was initially evaluated 06/15/2015 for chronic GERD and colon cancer screening. See that dictation. Subsequently underwent colonoscopy and upper endoscopy 08/18/2015. Colonoscopy revealed adenomatous colon polyps and hemorrhoids. Follow-up in 5 years recommended. Upper endoscopy revealed erosive esophagitis and inflammatory nodule at the gastroesophageal junction which was biopsied and returned inflammation. She was prescribed omeprazole 40 mg daily and asked to follow-up in 6 weeks. She presents this time for follow-up. Due to concerns over long term PPI use, she decreased her dose to 20 mg daily. Fortunately, completely asymptomatic on that dosage. No dysphagia or other issues. She does have questions regarding chronic PPI use.  REVIEW OF SYSTEMS:  All non-GI ROS negative upon review  Past Medical History:  Diagnosis Date  . Anemia   . Cancer ([place])    skin  . Cataract 2016   bilateral cataract surgery with lens implant  . Colon polyp   . Dysrhythmia   . GERD (gastroesophageal reflux disease)   . Hemorrhoids   . Shortness of breath dyspnea     Past Surgical History:  Procedure Laterality Date  . ABDOMINAL HYSTERECTOMY    . ARM WOUND REPAIR / CLOSURE    . CATARACT EXTRACTION W/PHACO Left 12/07/2014   Procedure: CATARACT EXTRACTION PHACO AND INTRAOCULAR LENS PLACEMENT (I[facility]);  Surgeon: Estill Cotta, MD;  Location: [facility] ORS;  Service: Ophthalmology;  Laterality: Left;  [id number] H us:01:14.8 AP%:24.1 CDE:32.29  . CATARACT EXTRACTION W/PHACO Right 02/01/2015   Procedure: CATARACT EXTRACTION PHACO AND INTRAOCULAR LENS PLACEMENT ([place]);  Surgeon: Estill Cotta, MD;  Location: [facility] ORS;  Service: Ophthalmology;  Laterality: Right;  [place] 2:12 AP% 59.5 CDE 65.14 fluid pack lot [id number]  . MOHS SURGERY     ear  . OPEN REDUCTION INTERNAL FIXATION (ORIF) FOOT LISFRANC FRACTURE      Social History Anna Liu  reports that she has never smoked. She has never used smokeless tobacco. She reports that she does not drink alcohol or use drugs.  family history includes Other in her mother. She was adopted.  No Known Allergies     PHYSICAL EXAMINATION: Vital signs: BP (!) 160/90   Pulse 82   Ht 5' 4.25" (1.632 m)   Wt 145 lb 4 oz (65.9 kg)   SpO2 97%   BMI 24.74 kg/m  General: Well-developed, well-nourished, no acute distress HEENT: Sclerae are anicteric, conjunctiva pink. Oral mucosa intact Lungs: Clear Heart: Regular Abdomen: soft, nontender, nondistended, no obvious ascites, no peritoneal signs, normal bowel sounds. No organomegaly. Extremities: No Clubbing, Cyanosis or edema Psychiatric: alert and oriented x3. Cooperative   ASSESSMENT:  #1. GERD with erosive esophagitis. Asymptomatic on omeprazole 20 mg daily #2. Adenomatous colon polyps    PLAN:  #1. Reflux precautions #2. Extensive discussion on current knowledge regarding safety concerns of long-term PPI use and current recommendations. Reviewed JAMA letter from 11/23/2015. All questions answered to her satisfaction #3. Continue omeprazole 20 mg daily #4. Routine office follow-up one year #5. Surveillance colonoscopy around June 2022  20 minutes was spent face-to-face with the patient. Near the entire time use for counseling regarding chronic GERD and a dressing safety concerns over long term PPI use

## 2016-05-19 ENCOUNTER — Encounter: Payer: 59 | Admitting: Primary Care

## 2016-10-27 ENCOUNTER — Encounter: Payer: Self-pay | Admitting: Internal Medicine

## 2020-08-25 ENCOUNTER — Encounter: Payer: Self-pay | Admitting: Internal Medicine

## 2022-04-14 ENCOUNTER — Other Ambulatory Visit: Payer: Self-pay

## 2022-04-14 ENCOUNTER — Emergency Department: Payer: Medicare Other

## 2022-04-14 ENCOUNTER — Emergency Department
Admission: EM | Admit: 2022-04-14 | Discharge: 2022-04-15 | Disposition: A | Discharge status: Home / Self Care | Payer: Medicare Other | Attending: Emergency Medicine | Admitting: Emergency Medicine

## 2022-04-14 DIAGNOSIS — N390 Urinary tract infection, site not specified: Secondary | ICD-10-CM

## 2022-04-14 DIAGNOSIS — R110 Nausea: Secondary | ICD-10-CM | POA: Insufficient documentation

## 2022-04-14 DIAGNOSIS — R1031 Right lower quadrant pain: Secondary | ICD-10-CM | POA: Diagnosis present

## 2022-04-14 LAB — URINALYSIS, ROUTINE W REFLEX MICROSCOPIC
Bilirubin Urine: NEGATIVE
Glucose, UA: NEGATIVE mg/dL
Ketones, ur: 5 mg/dL — AB
Nitrite: NEGATIVE
Protein, ur: 30 mg/dL — AB
RBC / HPF: >50 RBC/hpf (ref 0–5)
Specific Gravity, Urine: 1.02 (ref 1.005–1.030)
pH: 5 (ref 5.0–8.0)

## 2022-04-14 LAB — COMPREHENSIVE METABOLIC PANEL
ALT: 13 U/L (ref 0–44)
AST: 16 U/L (ref 15–41)
Albumin: 4.3 g/dL (ref 3.5–5.0)
Alkaline Phosphatase: 61 U/L (ref 38–126)
Anion gap: 14 (ref 5–15)
BUN: 15 mg/dL (ref 8–23)
CO2: 22 mmol/L (ref 22–32)
Calcium: 9.3 mg/dL (ref 8.9–10.3)
Chloride: 103 mmol/L (ref 98–111)
Creatinine, Ser: 0.73 mg/dL (ref 0.44–1.00)
GFR, Estimated: >60 mL/min (ref >60)
Glucose, Bld: 105 mg/dL — ABNORMAL HIGH (ref 70–99)
Potassium: 3.5 mmol/L (ref 3.5–5.1)
Sodium: 139 mmol/L (ref 135–145)
Total Bilirubin: 0.8 mg/dL (ref 0.3–1.2)
Total Protein: 6.6 g/dL (ref 6.5–8.1)

## 2022-04-14 LAB — CBC
HCT: 40.2 % (ref 36.0–46.0)
Hemoglobin: 13.1 g/dL (ref 12.0–15.0)
MCH: 29.2 pg (ref 26.0–34.0)
MCHC: 32.6 g/dL (ref 30.0–36.0)
MCV: 89.7 fL (ref 80.0–100.0)
Platelets: 278 10*3/uL (ref 150–400)
RBC: 4.48 MIL/uL (ref 3.87–5.11)
RDW: 12.5 % (ref 11.5–15.5)
WBC: 8.9 10*3/uL (ref 4.0–10.5)
nRBC: 0 % (ref 0.0–0.2)

## 2022-04-14 LAB — LIPASE, BLOOD: Lipase: 86 U/L — ABNORMAL HIGH (ref 11–51)

## 2022-04-14 MED ORDER — CEPHALEXIN 500 MG PO CAPS
500.0000 mg | ORAL_CAPSULE | Freq: Once | ORAL | Status: AC
  Administered 2022-04-14: 500 mg via ORAL
  Filled 2022-04-14: qty 1

## 2022-04-14 MED ORDER — CEPHALEXIN 500 MG PO CAPS: 500.0000 mg | ORAL_CAPSULE | Freq: Three times a day (TID) | ORAL | 0 refills | Status: AC

## 2022-04-14 NOTE — ED Triage Notes (Addendum)
Pt to ED from home for urinary complaints. Pt states she urinated and her urine looked "weird". Pt had a picture to show staff. Pt is CAOx4 and in no acute distress and ambulatory in triage. Pt does have some lower abdominal pain and lower back pain. Pt denies any pain during urination and no foul smell or discharge coming from vagina.

## 2022-04-14 NOTE — Discharge Instructions (Addendum)
Use Tylenol for pain and fevers.  Up to 1000 mg per dose, up to 4 times per day.  Do not take more than 4000 mg of Tylenol/acetaminophen within 24 hours..  Please take the Keflex antibiotic 3 times daily for the next 5 days to treat a possible UTI

## 2022-04-14 NOTE — ED Provider Notes (Signed)
Patient received at signout from Dr. Corky Downs pending a CT renal study to assess for ureteral stone.  This images reviewed by me and without evidence of intra-abdominal pathology.  No ureteral obstruction.  I educate the patient of this and she will be discharged with a course of Keflex per original plan of care.  We discussed return precautions.   Vladimir Crofts, MD 04/14/22 [phone]

## 2022-04-14 NOTE — ED Provider Notes (Signed)
[facility] Provider Note    Event Date/Time   First MD Initiated Contact with Patient 04/14/22 2158     (approximate)   History   Abdominal Pain (RLQ)   HPI  Ranim Hertling is a 72 y.o. female who presents with complaints of abnormal urine.  Patient reports she urinated today and she notes that her urine looked unusual in the toilet.  She also had pain in her right flank briefly and resolved.  She felt nauseated while in the waiting room.  Feels better now.  No fevers.     Physical Exam   Triage Vital Signs: ED Triage Vitals  Enc Vitals Group     BP 04/14/22 2108 (!) 192/80     Pulse Rate 04/14/22 2108 (!) 52     Resp 04/14/22 2108 16     Temp 04/14/22 2108 98 F (36.7 C)     Temp Source 04/14/22 2108 Oral     SpO2 04/14/22 2108 98 %     Weight 04/14/22 2109 56.7 kg (125 lb)     Height 04/14/22 2109 1.626 m (5' 4"$ )     Head Circumference --      Peak Flow --      Pain Score 04/14/22 2108 2     Pain Loc --      Pain Edu? --      Excl. in [place]? --     Most recent vital signs: Vitals:   04/14/22 2108  BP: (!) 192/80  Pulse: (!) 52  Resp: 16  Temp: 98 F (36.7 C)  SpO2: 98%     General: Awake, no distress.  CV:  Good peripheral perfusion.  Resp:  Normal effort.  Abd:  No distention.  No CVA tenderness Other:     ED Results / Procedures / Treatments   Labs (all labs ordered are listed, but only abnormal results are displayed) Labs Reviewed  LIPASE, BLOOD - Abnormal; Notable for the following components:      Result Value   Lipase 86 (*)    All other components within normal limits  COMPREHENSIVE METABOLIC PANEL - Abnormal; Notable for the following components:   Glucose, Bld 105 (*)    All other components within normal limits  URINALYSIS, ROUTINE W REFLEX MICROSCOPIC - Abnormal; Notable for the following components:   Color, Urine YELLOW (*)    APPearance HAZY (*)    Hgb urine dipstick LARGE (*)    Ketones, ur 5 (*)     Protein, ur 30 (*)    Leukocytes,Ua TRACE (*)    Bacteria, UA RARE (*)    All other components within normal limits  CBC     EKG     RADIOLOGY CT renal stone study pending    PROCEDURES:  Critical Care performed:   Procedures   MEDICATIONS ORDERED IN ED: Medications  cephALEXin (KEFLEX) capsule 500 mg (has no administration in time range)     IMPRESSION / MDM / ASSESSMENT AND PLAN / ED COURSE  I reviewed the triage vital signs and the nursing notes. Patient's presentation is most consistent with acute illness / injury with system symptoms.   Patient presents with symptoms as above, differential includes UTI, ureterolithiasis less likely.  Lab work reviewed and is overall reassuring, urinalysis with hemoglobin and positive leukocytes, will send for CT renal stone study to rule out ureterolithiasis  Will start the patient on Keflex, anticipate discharge, have asked my colleague to follow-up on CT results.  FINAL CLINICAL IMPRESSION(S) / ED DIAGNOSES   Final diagnoses:  Lower urinary tract infectious disease     Rx / DC Orders   ED Discharge Orders     None        Note:  This document was prepared using Dragon voice recognition software and may include unintentional dictation errors.   Lavonia Drafts, MD 04/14/22 [phone]

## 2022-04-24 ENCOUNTER — Telehealth: Payer: Self-pay

## 2022-04-24 NOTE — Telephone Encounter (Signed)
Please call patient received below message from access nurse.   Caller states she has an appt 06/06/22 at 920am that she needs to cancel.

## 2022-06-06 ENCOUNTER — Ambulatory Visit: Payer: Medicare Other | Admitting: Nurse Practitioner
